# Patient Record
Sex: Female | Born: 1950 | Race: Black or African American | Hispanic: No | Smoking: Never smoker
Health system: Southern US, Community
[De-identification: ages and names within clinical notes are randomized; demographics above are authoritative.]

## PROBLEM LIST (undated history)

## (undated) DIAGNOSIS — I773 Arterial fibromuscular dysplasia: Secondary | ICD-10-CM

## (undated) DIAGNOSIS — D219 Benign neoplasm of connective and other soft tissue, unspecified: Secondary | ICD-10-CM

## (undated) DIAGNOSIS — I10 Essential (primary) hypertension: Secondary | ICD-10-CM

## (undated) DIAGNOSIS — E079 Disorder of thyroid, unspecified: Secondary | ICD-10-CM

## (undated) DIAGNOSIS — M1990 Unspecified osteoarthritis, unspecified site: Secondary | ICD-10-CM

## (undated) DIAGNOSIS — E559 Vitamin D deficiency, unspecified: Secondary | ICD-10-CM

## (undated) DIAGNOSIS — M810 Age-related osteoporosis without current pathological fracture: Secondary | ICD-10-CM

## (undated) HISTORY — DX: Disorder of thyroid, unspecified: E07.9

## (undated) HISTORY — DX: Unspecified osteoarthritis, unspecified site: M19.90

## (undated) HISTORY — DX: Benign neoplasm of connective and other soft tissue, unspecified: D21.9

## (undated) HISTORY — DX: Vitamin D deficiency, unspecified: E55.9

## (undated) HISTORY — DX: Age-related osteoporosis without current pathological fracture: M81.0

---

## 1994-07-27 HISTORY — PX: ABDOMINAL HYSTERECTOMY: SHX81

## 1997-04-30 ENCOUNTER — Encounter: Admission: RE | Admit: 1997-04-30 | Discharge: 1997-04-30 | Payer: Self-pay | Admitting: Sports Medicine

## 1997-05-04 ENCOUNTER — Encounter: Admission: RE | Admit: 1997-05-04 | Discharge: 1997-05-04 | Payer: Self-pay | Admitting: Family Medicine

## 1997-05-07 ENCOUNTER — Encounter: Admission: RE | Admit: 1997-05-07 | Discharge: 1997-05-07 | Payer: Self-pay | Admitting: Family Medicine

## 1997-05-24 ENCOUNTER — Encounter: Admission: RE | Admit: 1997-05-24 | Discharge: 1997-05-24 | Payer: Self-pay | Admitting: Sports Medicine

## 1997-06-06 ENCOUNTER — Encounter: Admission: RE | Admit: 1997-06-06 | Discharge: 1997-06-06 | Payer: Self-pay | Admitting: Family Medicine

## 1997-07-31 ENCOUNTER — Encounter: Admission: RE | Admit: 1997-07-31 | Discharge: 1997-07-31 | Payer: Self-pay | Admitting: Family Medicine

## 1997-08-02 ENCOUNTER — Encounter: Admission: RE | Admit: 1997-08-02 | Discharge: 1997-08-02 | Payer: Self-pay | Admitting: Family Medicine

## 1997-08-07 ENCOUNTER — Encounter: Admission: RE | Admit: 1997-08-07 | Discharge: 1997-08-07 | Payer: Self-pay | Admitting: Family Medicine

## 1997-08-09 ENCOUNTER — Encounter: Admission: RE | Admit: 1997-08-09 | Discharge: 1997-08-09 | Payer: Self-pay | Admitting: Family Medicine

## 1997-09-20 ENCOUNTER — Ambulatory Visit (HOSPITAL_COMMUNITY): Admission: RE | Admit: 1997-09-20 | Discharge: 1997-09-20 | Payer: Self-pay | Admitting: *Deleted

## 1997-09-26 ENCOUNTER — Ambulatory Visit (HOSPITAL_COMMUNITY): Admission: RE | Admit: 1997-09-26 | Discharge: 1997-09-26 | Payer: Self-pay | Admitting: *Deleted

## 1997-09-26 ENCOUNTER — Encounter: Payer: Self-pay | Admitting: *Deleted

## 1998-02-22 ENCOUNTER — Encounter: Admission: RE | Admit: 1998-02-22 | Discharge: 1998-02-22 | Payer: Self-pay | Admitting: Family Medicine

## 1998-02-28 ENCOUNTER — Emergency Department (HOSPITAL_COMMUNITY): Admission: EM | Admit: 1998-02-28 | Discharge: 1998-02-28 | Payer: Self-pay | Admitting: Emergency Medicine

## 1998-05-14 ENCOUNTER — Encounter: Admission: RE | Admit: 1998-05-14 | Discharge: 1998-05-14 | Payer: Self-pay | Admitting: Family Medicine

## 1998-05-20 ENCOUNTER — Encounter: Admission: RE | Admit: 1998-05-20 | Discharge: 1998-05-20 | Payer: Self-pay | Admitting: Family Medicine

## 1998-05-22 ENCOUNTER — Emergency Department (HOSPITAL_COMMUNITY): Admission: EM | Admit: 1998-05-22 | Discharge: 1998-05-22 | Payer: Self-pay | Admitting: Emergency Medicine

## 1998-05-22 ENCOUNTER — Encounter: Payer: Self-pay | Admitting: Emergency Medicine

## 1998-10-01 ENCOUNTER — Encounter: Payer: Self-pay | Admitting: *Deleted

## 1998-10-01 ENCOUNTER — Ambulatory Visit (HOSPITAL_COMMUNITY): Admission: RE | Admit: 1998-10-01 | Discharge: 1998-10-01 | Payer: Self-pay | Admitting: *Deleted

## 1999-01-29 ENCOUNTER — Encounter: Admission: RE | Admit: 1999-01-29 | Discharge: 1999-01-29 | Payer: Self-pay | Admitting: Family Medicine

## 1999-02-23 ENCOUNTER — Emergency Department (HOSPITAL_COMMUNITY): Admission: EM | Admit: 1999-02-23 | Discharge: 1999-02-23 | Payer: Self-pay | Admitting: Emergency Medicine

## 1999-03-28 ENCOUNTER — Encounter: Admission: RE | Admit: 1999-03-28 | Discharge: 1999-03-28 | Payer: Self-pay | Admitting: Family Medicine

## 1999-07-08 ENCOUNTER — Encounter: Admission: RE | Admit: 1999-07-08 | Discharge: 1999-07-08 | Payer: Self-pay | Admitting: Sports Medicine

## 1999-07-28 ENCOUNTER — Ambulatory Visit (HOSPITAL_COMMUNITY): Admission: RE | Admit: 1999-07-28 | Discharge: 1999-07-28 | Payer: Self-pay | Admitting: General Surgery

## 1999-07-28 ENCOUNTER — Encounter (INDEPENDENT_AMBULATORY_CARE_PROVIDER_SITE_OTHER): Payer: Self-pay | Admitting: Specialist

## 1999-10-22 ENCOUNTER — Encounter: Payer: Self-pay | Admitting: *Deleted

## 1999-10-22 ENCOUNTER — Ambulatory Visit (HOSPITAL_COMMUNITY): Admission: RE | Admit: 1999-10-22 | Discharge: 1999-10-22 | Payer: Self-pay | Admitting: *Deleted

## 2000-01-22 ENCOUNTER — Other Ambulatory Visit: Admission: RE | Admit: 2000-01-22 | Discharge: 2000-01-22 | Payer: Self-pay | Admitting: *Deleted

## 2000-07-28 ENCOUNTER — Encounter: Admission: RE | Admit: 2000-07-28 | Discharge: 2000-07-28 | Payer: Self-pay | Admitting: Family Medicine

## 2000-10-22 ENCOUNTER — Ambulatory Visit (HOSPITAL_COMMUNITY): Admission: RE | Admit: 2000-10-22 | Discharge: 2000-10-22 | Payer: Self-pay | Admitting: *Deleted

## 2000-10-22 ENCOUNTER — Encounter: Payer: Self-pay | Admitting: *Deleted

## 2001-02-24 ENCOUNTER — Encounter: Admission: RE | Admit: 2001-02-24 | Discharge: 2001-02-24 | Payer: Self-pay | Admitting: Family Medicine

## 2001-11-30 ENCOUNTER — Encounter: Admission: RE | Admit: 2001-11-30 | Discharge: 2001-11-30 | Payer: Self-pay | Admitting: Family Medicine

## 2001-12-06 ENCOUNTER — Encounter: Admission: RE | Admit: 2001-12-06 | Discharge: 2001-12-06 | Payer: Self-pay | Admitting: Family Medicine

## 2002-05-08 ENCOUNTER — Encounter: Admission: RE | Admit: 2002-05-08 | Discharge: 2002-05-08 | Payer: Self-pay | Admitting: Family Medicine

## 2002-07-27 ENCOUNTER — Encounter: Admission: RE | Admit: 2002-07-27 | Discharge: 2002-07-27 | Payer: Self-pay | Admitting: Family Medicine

## 2003-06-13 ENCOUNTER — Encounter: Admission: RE | Admit: 2003-06-13 | Discharge: 2003-06-13 | Payer: Self-pay | Admitting: Family Medicine

## 2003-06-27 ENCOUNTER — Encounter: Admission: RE | Admit: 2003-06-27 | Discharge: 2003-06-27 | Payer: Self-pay | Admitting: Family Medicine

## 2003-10-31 ENCOUNTER — Ambulatory Visit: Payer: Self-pay

## 2003-11-07 ENCOUNTER — Encounter: Admission: RE | Admit: 2003-11-07 | Discharge: 2003-12-03 | Payer: Self-pay | Admitting: Sports Medicine

## 2003-11-27 ENCOUNTER — Encounter (INDEPENDENT_AMBULATORY_CARE_PROVIDER_SITE_OTHER): Payer: Self-pay | Admitting: *Deleted

## 2003-11-27 LAB — CONVERTED CEMR LAB

## 2003-12-14 ENCOUNTER — Ambulatory Visit: Payer: Self-pay | Admitting: Sports Medicine

## 2004-07-04 ENCOUNTER — Ambulatory Visit: Payer: Self-pay | Admitting: Family Medicine

## 2005-01-12 ENCOUNTER — Ambulatory Visit: Payer: Self-pay | Admitting: Family Medicine

## 2005-02-10 ENCOUNTER — Ambulatory Visit: Payer: Self-pay | Admitting: Family Medicine

## 2005-09-16 ENCOUNTER — Ambulatory Visit: Payer: Self-pay | Admitting: Family Medicine

## 2005-12-21 ENCOUNTER — Ambulatory Visit: Payer: Self-pay | Admitting: Family Medicine

## 2006-03-22 ENCOUNTER — Encounter (INDEPENDENT_AMBULATORY_CARE_PROVIDER_SITE_OTHER): Payer: Self-pay | Admitting: Family Medicine

## 2006-03-25 DIAGNOSIS — I10 Essential (primary) hypertension: Secondary | ICD-10-CM | POA: Insufficient documentation

## 2006-03-25 DIAGNOSIS — N6029 Fibroadenosis of unspecified breast: Secondary | ICD-10-CM

## 2006-03-26 ENCOUNTER — Encounter (INDEPENDENT_AMBULATORY_CARE_PROVIDER_SITE_OTHER): Payer: Self-pay | Admitting: *Deleted

## 2006-04-07 ENCOUNTER — Encounter (INDEPENDENT_AMBULATORY_CARE_PROVIDER_SITE_OTHER): Payer: Self-pay | Admitting: Family Medicine

## 2006-04-09 ENCOUNTER — Telehealth (INDEPENDENT_AMBULATORY_CARE_PROVIDER_SITE_OTHER): Payer: Self-pay | Admitting: Family Medicine

## 2006-04-12 ENCOUNTER — Telehealth: Payer: Self-pay | Admitting: *Deleted

## 2006-04-16 ENCOUNTER — Encounter (INDEPENDENT_AMBULATORY_CARE_PROVIDER_SITE_OTHER): Payer: Self-pay | Admitting: Family Medicine

## 2006-06-01 ENCOUNTER — Ambulatory Visit (HOSPITAL_COMMUNITY): Admission: RE | Admit: 2006-06-01 | Discharge: 2006-06-01 | Payer: Self-pay | Admitting: Family Medicine

## 2006-06-01 ENCOUNTER — Encounter (INDEPENDENT_AMBULATORY_CARE_PROVIDER_SITE_OTHER): Payer: Self-pay | Admitting: Family Medicine

## 2006-06-01 ENCOUNTER — Ambulatory Visit: Payer: Self-pay | Admitting: Family Medicine

## 2006-06-01 LAB — CONVERTED CEMR LAB
Calcium: 9 mg/dL (ref 8.4–10.5)
Creatinine, Ser: 0.74 mg/dL (ref 0.40–1.20)
Sodium: 144 meq/L (ref 135–145)

## 2006-06-02 ENCOUNTER — Telehealth: Payer: Self-pay | Admitting: *Deleted

## 2006-06-03 ENCOUNTER — Encounter (INDEPENDENT_AMBULATORY_CARE_PROVIDER_SITE_OTHER): Payer: Self-pay | Admitting: Family Medicine

## 2006-06-07 ENCOUNTER — Telehealth (INDEPENDENT_AMBULATORY_CARE_PROVIDER_SITE_OTHER): Payer: Self-pay | Admitting: Family Medicine

## 2006-07-13 ENCOUNTER — Ambulatory Visit: Payer: Self-pay | Admitting: Sports Medicine

## 2006-07-20 ENCOUNTER — Telehealth (INDEPENDENT_AMBULATORY_CARE_PROVIDER_SITE_OTHER): Payer: Self-pay | Admitting: Family Medicine

## 2006-07-28 ENCOUNTER — Ambulatory Visit: Payer: Self-pay | Admitting: Cardiology

## 2006-07-28 ENCOUNTER — Ambulatory Visit (HOSPITAL_COMMUNITY): Admission: RE | Admit: 2006-07-28 | Discharge: 2006-07-28 | Payer: Self-pay | Admitting: Family Medicine

## 2006-07-28 ENCOUNTER — Ambulatory Visit: Payer: Self-pay | Admitting: Family Medicine

## 2006-07-28 ENCOUNTER — Encounter (INDEPENDENT_AMBULATORY_CARE_PROVIDER_SITE_OTHER): Payer: Self-pay | Admitting: Family Medicine

## 2006-07-28 ENCOUNTER — Encounter: Payer: Self-pay | Admitting: Family Medicine

## 2006-07-28 LAB — CONVERTED CEMR LAB
BUN: 15 mg/dL (ref 6–23)
CO2: 26 meq/L (ref 19–32)
Creatinine, Ser: 0.81 mg/dL (ref 0.40–1.20)
Glucose, Bld: 102 mg/dL — ABNORMAL HIGH (ref 70–99)
Sodium: 139 meq/L (ref 135–145)

## 2006-07-29 ENCOUNTER — Encounter (INDEPENDENT_AMBULATORY_CARE_PROVIDER_SITE_OTHER): Payer: Self-pay | Admitting: Family Medicine

## 2006-08-09 ENCOUNTER — Ambulatory Visit: Payer: Self-pay | Admitting: Family Medicine

## 2006-09-29 ENCOUNTER — Encounter: Payer: Self-pay | Admitting: *Deleted

## 2006-10-22 ENCOUNTER — Encounter: Admission: RE | Admit: 2006-10-22 | Discharge: 2006-10-22 | Payer: Self-pay | Admitting: Internal Medicine

## 2006-11-10 ENCOUNTER — Encounter: Admission: RE | Admit: 2006-11-10 | Discharge: 2006-11-10 | Payer: Self-pay | Admitting: Internal Medicine

## 2007-02-10 ENCOUNTER — Other Ambulatory Visit: Admission: RE | Admit: 2007-02-10 | Discharge: 2007-02-10 | Payer: Self-pay | Admitting: Obstetrics and Gynecology

## 2007-06-28 ENCOUNTER — Emergency Department (HOSPITAL_COMMUNITY): Admission: EM | Admit: 2007-06-28 | Discharge: 2007-06-29 | Payer: Self-pay | Admitting: Emergency Medicine

## 2009-05-06 ENCOUNTER — Emergency Department (HOSPITAL_BASED_OUTPATIENT_CLINIC_OR_DEPARTMENT_OTHER): Admission: EM | Admit: 2009-05-06 | Discharge: 2009-05-06 | Payer: Self-pay | Admitting: Emergency Medicine

## 2009-05-06 ENCOUNTER — Ambulatory Visit: Payer: Self-pay | Admitting: Diagnostic Radiology

## 2009-05-29 ENCOUNTER — Encounter: Admission: RE | Admit: 2009-05-29 | Discharge: 2009-05-29 | Payer: Self-pay | Admitting: Internal Medicine

## 2009-06-20 ENCOUNTER — Ambulatory Visit (HOSPITAL_COMMUNITY): Admission: RE | Admit: 2009-06-20 | Discharge: 2009-06-20 | Payer: Self-pay | Admitting: Internal Medicine

## 2009-07-31 ENCOUNTER — Other Ambulatory Visit: Admission: RE | Admit: 2009-07-31 | Discharge: 2009-07-31 | Payer: Self-pay | Admitting: Interventional Radiology

## 2009-07-31 ENCOUNTER — Encounter: Admission: RE | Admit: 2009-07-31 | Discharge: 2009-07-31 | Payer: Self-pay | Admitting: Interventional Radiology

## 2009-12-25 ENCOUNTER — Encounter: Admission: RE | Admit: 2009-12-25 | Discharge: 2009-12-25 | Payer: Self-pay | Admitting: Nephrology

## 2009-12-31 ENCOUNTER — Encounter: Admission: RE | Admit: 2009-12-31 | Discharge: 2009-12-31 | Payer: Self-pay | Admitting: Internal Medicine

## 2010-01-16 ENCOUNTER — Ambulatory Visit (HOSPITAL_COMMUNITY)
Admission: RE | Admit: 2010-01-16 | Discharge: 2010-01-16 | Payer: Self-pay | Source: Home / Self Care | Attending: Interventional Radiology | Admitting: Interventional Radiology

## 2010-01-16 HISTORY — PX: OTHER SURGICAL HISTORY: SHX169

## 2010-02-16 ENCOUNTER — Encounter: Payer: Self-pay | Admitting: Nephrology

## 2010-04-07 LAB — CBC
MCH: 29.7 pg (ref 26.0–34.0)
MCHC: 33.5 g/dL (ref 30.0–36.0)
RBC: 4.38 MIL/uL (ref 3.87–5.11)
WBC: 6.2 10*3/uL (ref 4.0–10.5)

## 2010-04-07 LAB — POCT I-STAT, CHEM 8
HCT: 42 % (ref 36.0–46.0)
Potassium: 3.1 mEq/L — ABNORMAL LOW (ref 3.5–5.1)
TCO2: 30 mmol/L (ref 0–100)

## 2010-04-07 LAB — PROTIME-INR
INR: 0.93 (ref 0.00–1.49)
Prothrombin Time: 12.7 seconds (ref 11.6–15.2)

## 2010-04-14 LAB — CBC
HCT: 33.5 % — ABNORMAL LOW (ref 36.0–46.0)
Hemoglobin: 11.6 g/dL — ABNORMAL LOW (ref 12.0–15.0)
MCHC: 34.6 g/dL (ref 30.0–36.0)
MCV: 89.8 fL (ref 78.0–100.0)
Platelets: 205 10*3/uL (ref 150–400)
RDW: 12.9 % (ref 11.5–15.5)

## 2010-04-14 LAB — BASIC METABOLIC PANEL
Creatinine, Ser: 0.79 mg/dL (ref 0.4–1.2)
GFR calc Af Amer: >60 mL/min (ref >60)
Glucose, Bld: 100 mg/dL — ABNORMAL HIGH (ref 70–99)
Sodium: 139 mEq/L (ref 135–145)

## 2010-04-14 LAB — PROTIME-INR
INR: 0.96 (ref 0.00–1.49)
Prothrombin Time: 12.7 seconds (ref 11.6–15.2)

## 2010-04-14 LAB — APTT: aPTT: 27 seconds (ref 24–37)

## 2010-04-16 LAB — DIFFERENTIAL
Basophils Absolute: 0.1 10*3/uL (ref 0.0–0.1)
Lymphocytes Relative: 8 % — ABNORMAL LOW (ref 12–46)
Neutro Abs: 7.6 10*3/uL (ref 1.7–7.7)

## 2010-04-16 LAB — CBC
Platelets: 213 10*3/uL (ref 150–400)
RDW: 12.7 % (ref 11.5–15.5)
WBC: 8.5 10*3/uL (ref 4.0–10.5)

## 2010-04-16 LAB — POCT CARDIAC MARKERS
CKMB, poc: <1 ng/mL — ABNORMAL LOW (ref 1.0–8.0)
CKMB, poc: <1 ng/mL — ABNORMAL LOW (ref 1.0–8.0)
Myoglobin, poc: 57.9 ng/mL (ref 12–200)

## 2010-04-16 LAB — BASIC METABOLIC PANEL
BUN: 20 mg/dL (ref 6–23)
Calcium: 9.1 mg/dL (ref 8.4–10.5)
GFR calc non Af Amer: 46 mL/min — ABNORMAL LOW (ref >60)
Glucose, Bld: 135 mg/dL — ABNORMAL HIGH (ref 70–99)
Sodium: 146 mEq/L — ABNORMAL HIGH (ref 135–145)

## 2010-04-16 LAB — D-DIMER, QUANTITATIVE: D-Dimer, Quant: 0.49 ug/mL-FEU — ABNORMAL HIGH (ref 0.00–0.48)

## 2010-05-14 ENCOUNTER — Other Ambulatory Visit: Payer: Self-pay | Admitting: Interventional Radiology

## 2010-05-14 DIAGNOSIS — I701 Atherosclerosis of renal artery: Secondary | ICD-10-CM

## 2010-06-04 ENCOUNTER — Inpatient Hospital Stay: Admission: RE | Admit: 2010-06-04 | Payer: Self-pay | Source: Ambulatory Visit

## 2010-06-13 NOTE — Op Note (Signed)
[place]  Patient:    ILSE, BILLMAN                    MRN: [id number] Proc. Date: 07/28/99 Adm. Date:  [id number] Attending:  Brandy Hale CC:         Jonnie Finner, M.D., [facility]                           Operative Report  PREOPERATIVE DIAGNOSIS:  Soft tissue mass of right back.  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  OPERATION PERFORMED:  Excision of 8.0 cm diameter soft tissue mass of the right upper back.  SURGEON:  Angelia Mould. Derrell Lolling, M.D.  OPERATIVE INDICATIONS:  This is a 60 year old black female with a two year history of a subcutaneous mass of the right upper midback.  She states that this used to be about the size of a quarter and is now the size of a tennis ball.  No pain, no drainage, no trauma, no inflammation, no prior similar problems.  On exam, she has an 8-10 cm soft tissue mass in the right upper back overlying the lower scapula just above the bra strap.  This mass has the texture and consistency and appearance of a benign lipoma.  Because of enlargement and patients concern for her diagnosis, she is brought to the operating room electively for excision.  OPERATIVE TECHNIQUE:  The patient was brought to the minor procedure room at [facility], placed in the prone position.  The right upper and midback was prepped and draped in a sterile fashion.  The procedure was outlined with the marking pen.  Xylocaine 1% with epinephrine was used as a local infiltration anesthetic.  An oblique incision was made overlying the large soft tissue mass.  Dissection was carried down into the deep subcutaneous tissue, where we dissected out a large encapsulated fatty tumor approximately 8 x 6 x 4 cm in size.  This was not invading the muscle and had the appearance and consistency of a benign lipoma.  This was sent for routine histology.  The wound was irrigated  with saline.  Hemostasis was excellent and achieved with electrocautery.  The subcutaneous tissue was closed with interrupted sutures of 3-0 Vicryl and the skin closed with skin staples.  A clean bandage was placed and the patient taken to the recovery room in stable condition. Estimated blood loss was about 5 cc.  Complications:  None.  Sponge, needle, and instrument counts were correct. DD:  07/28/99 TD:  07/28/99 Job: [id number]

## 2010-06-17 ENCOUNTER — Ambulatory Visit
Admission: RE | Admit: 2010-06-17 | Discharge: 2010-06-17 | Disposition: A | Discharge status: Home / Self Care | Payer: BC Managed Care – PPO | Source: Ambulatory Visit | Attending: Interventional Radiology | Admitting: Interventional Radiology

## 2010-06-17 VITALS — BP 152/63 | HR 61 | Temp 97.9°F | Resp 14

## 2010-06-17 DIAGNOSIS — I701 Atherosclerosis of renal artery: Secondary | ICD-10-CM

## 2010-06-17 HISTORY — DX: Essential (primary) hypertension: I10

## 2010-07-01 ENCOUNTER — Other Ambulatory Visit: Payer: Self-pay | Admitting: Internal Medicine

## 2010-07-01 DIAGNOSIS — E041 Nontoxic single thyroid nodule: Secondary | ICD-10-CM

## 2010-07-16 ENCOUNTER — Ambulatory Visit
Admission: RE | Admit: 2010-07-16 | Discharge: 2010-07-16 | Disposition: A | Discharge status: Home / Self Care | Payer: BC Managed Care – PPO | Source: Ambulatory Visit | Attending: Internal Medicine | Admitting: Internal Medicine

## 2010-07-16 DIAGNOSIS — E041 Nontoxic single thyroid nodule: Secondary | ICD-10-CM

## 2010-10-23 LAB — DIFFERENTIAL
Basophils Relative: 0
Monocytes Absolute: 0.4
Monocytes Relative: 6
Neutro Abs: 4.9

## 2010-10-23 LAB — POCT I-STAT, CHEM 8
Creatinine, Ser: 1
Glucose, Bld: 113 — ABNORMAL HIGH
Hemoglobin: 12.6
Potassium: 3.2 — ABNORMAL LOW

## 2010-10-23 LAB — CBC
HCT: 34.3 — ABNORMAL LOW
Hemoglobin: 11.7 — ABNORMAL LOW
MCHC: 34.1
MCV: 89.9
RBC: 3.82 — ABNORMAL LOW

## 2010-10-23 LAB — POCT CARDIAC MARKERS
CKMB, poc: 1.3
Troponin i, poc: <0.05

## 2010-11-12 ENCOUNTER — Other Ambulatory Visit: Payer: Self-pay | Admitting: Interventional Radiology

## 2010-11-12 DIAGNOSIS — I773 Arterial fibromuscular dysplasia: Secondary | ICD-10-CM

## 2010-12-09 ENCOUNTER — Ambulatory Visit
Admission: RE | Admit: 2010-12-09 | Discharge: 2010-12-09 | Disposition: A | Discharge status: Home / Self Care | Payer: BC Managed Care – PPO | Source: Ambulatory Visit | Attending: Interventional Radiology | Admitting: Interventional Radiology

## 2010-12-09 VITALS — BP 143/67 | HR 55 | Temp 98.0°F | Resp 14 | Ht 66.0 in | Wt 134.0 lb

## 2010-12-09 DIAGNOSIS — I773 Arterial fibromuscular dysplasia: Secondary | ICD-10-CM

## 2010-12-09 HISTORY — DX: Arterial fibromuscular dysplasia: I77.3

## 2011-07-06 ENCOUNTER — Other Ambulatory Visit: Payer: Self-pay | Admitting: Internal Medicine

## 2011-07-06 DIAGNOSIS — E049 Nontoxic goiter, unspecified: Secondary | ICD-10-CM

## 2011-07-17 ENCOUNTER — Ambulatory Visit
Admission: RE | Admit: 2011-07-17 | Discharge: 2011-07-17 | Disposition: A | Discharge status: Home / Self Care | Payer: BC Managed Care – PPO | Source: Ambulatory Visit | Attending: Internal Medicine | Admitting: Internal Medicine

## 2011-07-17 DIAGNOSIS — E049 Nontoxic goiter, unspecified: Secondary | ICD-10-CM

## 2012-06-02 ENCOUNTER — Telehealth: Payer: Self-pay | Admitting: *Deleted

## 2012-06-02 NOTE — Telephone Encounter (Signed)
After review of BMD compared to 05/26/10.  There is a decrease at spine by 7.4%.  She went from -0.6 to -1.2 %.  This range is lower normal or upper osteopenic range = needs upper body exercise with weights.  For the right hip compared to 4/12 there is a slight decrease from -1.0 to -1.2 at the total hip reading.this also is upper osteopenic range  For the left hip compared to 4/12 there is a slight decrease from -1.8 to -2.0 at the neck of the hip which is in the lower osteopenic range.  For the hips with the slight changes considering not significant, but must still get in walking, treadmill, or stationary bike riding.   Must continue with calcium and vit D. Recheck again in 2 years.  Have her to get TSH checked again at PCP.  She is now off thyroid med's and that may be causing some bone loss if abnormal.

## 2012-06-02 NOTE — Telephone Encounter (Signed)
Keep in box until patient is notified

## 2012-06-02 NOTE — Telephone Encounter (Signed)
LMTCB  aa   (Need to review BMD results from [facility].)

## 2012-06-02 NOTE — Telephone Encounter (Signed)
[facility] SENT BONE DENSITY REPORT . PLACED ON YOUR DESK . WILL LOCATE CHART. SUE

## 2012-06-03 NOTE — Telephone Encounter (Signed)
Patient called reqeusting Bone Density Results. Read report and information from Shirlyn Goltz, FNP, to patient. Patient states she has appt. Next week with her PCP. Request a copy of this be mailed to her to take to her PCP who is not on EPIC and she is not on My Chart. Will mail this printed report to patient.

## 2012-06-09 ENCOUNTER — Telehealth: Payer: Self-pay | Admitting: *Deleted

## 2012-06-09 NOTE — Telephone Encounter (Signed)
Patient returned phone call to [facility] about her results.

## 2012-06-09 NOTE — Telephone Encounter (Signed)
LVM for pt to return my call in regards to DEXA results.  

## 2012-06-09 NOTE — Telephone Encounter (Signed)
Pt is aware of all DEXA scan results and is agreeable to calcium and vitamin d intake. Pt states she will also continue to exercise.

## 2012-06-09 NOTE — Telephone Encounter (Signed)
Message copied by Osie Bond on Thu Jun 09, 2012  3:02 PM ------      Message from: Ria Comment R      Created: Thu Jun 09, 2012 12:27 PM       Let patient know that since last exam in 2012 there has been a decrease at spine of 7.4 % which puts her at upper osteopenia at the spine.  At the right hip site she had minor loss of 3.4%. This puts her at the lower osteopenic range. Some of this is normal postmenopausal loss and Vit D deficiency.       She needs to continue with exercise and upper body weights and walking.  She must continue with calcium and vit D, repeat exam 2 years. ------

## 2012-06-09 NOTE — Telephone Encounter (Signed)
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  At the right hip site she had minor loss of 3.4%. This puts her at the lower osteopenic range. Some of this is normal postmenopausal loss and Vit D deficiency.       She needs to continue with exercise and upper body weights and walking.  She must continue with calcium and vit D, repeat exam 2 years. ------

## 2012-06-15 ENCOUNTER — Other Ambulatory Visit: Payer: Self-pay | Admitting: Gastroenterology

## 2012-07-11 ENCOUNTER — Other Ambulatory Visit: Payer: BC Managed Care – PPO

## 2012-09-03 ENCOUNTER — Encounter (HOSPITAL_COMMUNITY): Payer: Self-pay | Admitting: Emergency Medicine

## 2012-09-03 ENCOUNTER — Emergency Department (HOSPITAL_COMMUNITY): Payer: BC Managed Care – PPO

## 2012-09-03 ENCOUNTER — Emergency Department (HOSPITAL_COMMUNITY)
Admission: EM | Admit: 2012-09-03 | Discharge: 2012-09-03 | Disposition: A | Discharge status: Home / Self Care | Payer: BC Managed Care – PPO | Attending: Emergency Medicine | Admitting: Emergency Medicine

## 2012-09-03 DIAGNOSIS — Z8679 Personal history of other diseases of the circulatory system: Secondary | ICD-10-CM | POA: Insufficient documentation

## 2012-09-03 DIAGNOSIS — Z79899 Other long term (current) drug therapy: Secondary | ICD-10-CM | POA: Insufficient documentation

## 2012-09-03 DIAGNOSIS — R0789 Other chest pain: Secondary | ICD-10-CM | POA: Insufficient documentation

## 2012-09-03 DIAGNOSIS — R079 Chest pain, unspecified: Secondary | ICD-10-CM

## 2012-09-03 DIAGNOSIS — I10 Essential (primary) hypertension: Secondary | ICD-10-CM | POA: Insufficient documentation

## 2012-09-03 LAB — CBC
HCT: 33.9 % — ABNORMAL LOW (ref 36.0–46.0)
Hemoglobin: 11.3 g/dL — ABNORMAL LOW (ref 12.0–15.0)
MCH: 29.7 pg (ref 26.0–34.0)
MCHC: 33.3 g/dL (ref 30.0–36.0)
MCV: 89.2 fL (ref 78.0–100.0)
RDW: 12.9 % (ref 11.5–15.5)

## 2012-09-03 LAB — BASIC METABOLIC PANEL
BUN: 16 mg/dL (ref 6–23)
Calcium: 9.1 mg/dL (ref 8.4–10.5)
Creatinine, Ser: 0.72 mg/dL (ref 0.50–1.10)
GFR calc Af Amer: >90 mL/min (ref >90)
GFR calc non Af Amer: >90 mL/min (ref >90)
Glucose, Bld: 127 mg/dL — ABNORMAL HIGH (ref 70–99)

## 2012-09-03 MED ORDER — HYDROCODONE-ACETAMINOPHEN 5-325 MG PO TABS: 1.0000 | ORAL_TABLET | ORAL | Status: DC | PRN

## 2012-09-03 MED ORDER — KETOROLAC TROMETHAMINE 30 MG/ML IJ SOLN
30.0000 mg | Freq: Once | INTRAMUSCULAR | Status: AC
  Administered 2012-09-03: 30 mg via INTRAVENOUS
  Filled 2012-09-03: qty 1

## 2012-09-03 MED ORDER — IBUPROFEN 400 MG PO TABS: 400.0000 mg | ORAL_TABLET | Freq: Four times a day (QID) | ORAL | Status: DC | PRN

## 2012-09-03 MED ORDER — OMEPRAZOLE 20 MG PO CPDR: 20.0000 mg | DELAYED_RELEASE_CAPSULE | Freq: Every day | ORAL | Status: DC

## 2012-09-03 MED ORDER — MORPHINE SULFATE 4 MG/ML IJ SOLN
4.0000 mg | Freq: Once | INTRAMUSCULAR | Status: AC
  Administered 2012-09-03: 4 mg via INTRAVENOUS
  Filled 2012-09-03: qty 1

## 2012-09-03 NOTE — ED Notes (Signed)
100% on room air.

## 2012-09-03 NOTE — ED Notes (Signed)
Pt c/o chest tightness upon awakening this am, prior to getting out of bed.  Hx of HTN.  No relieving factors.  Took "2 Tylenals" this am approx 0730 this am.

## 2012-09-03 NOTE — ED Notes (Signed)
Patient remains in the ED.  Please disregard previous note concerning discharge and care handoff.

## 2012-09-03 NOTE — ED Provider Notes (Signed)
CSN: [id number]     Arrival date & time 09/03/12  0905 History     First MD Initiated Contact with Patient 09/03/12 0913     Chief Complaint  Patient presents with  . Chest Pain    The history is provided by the patient.   Patient reports some chest tightness and "soreness" that began this morning when she awoke.  The pain has been persistent through the morning.  She denies shortness of breath.  No diaphoresis.  No nausea or vomiting.  No radiation of her pain.  She reports her movement of her arm seems to worsen her pain.  No recent heavy lifting or strenuous activity.  No fevers or chills.  No cough or congestion.  No history of cardiac disease.  The patient is 62 years old she has a history of hypertension.  She does not smoke cigarettes.  No diabetes or high cholesterol.  She states she has had no recent exertional shortness of breath or chest pain.  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  She has been in her normal state of health.  Her pain is mild in severity.  She tried two Tylenol this morning without improvement in her symptoms    Past Medical History  Diagnosis Date  . Hypertension     Bilateral renal artery fibromuscular dysplasia  . Fibromuscular dysplasia of renal artery     Right   Past Surgical History  Procedure Laterality Date  . Abdominal hysterectomy    . Angioplasty of right renal artery  01-16-2010    Recurrent/residual fibromuscular dysplasia   History reviewed. No pertinent family history. History  Substance Use Topics  . Smoking status: Never Smoker   . Smokeless tobacco: Never Used  . Alcohol Use: Not on file   OB History   Grav Para Term Preterm Abortions TAB SAB Ect Mult Living                 Review of Systems  All other systems reviewed and are negative.    Allergies  Review of patient's allergies indicates no known allergies.  Home Medications   Current Outpatient Rx  Name  Route  Sig  Dispense  Refill  .  amLODipine-olmesartan (AZOR) 10-40 MG per tablet   Oral   Take 1 tablet by mouth every morning.         . metoprolol-hydrochlorothiazide (LOPRESSOR HCT) 50-25 MG per tablet   Oral   Take 1 tablet by mouth daily.           . Multiple Vitamin (MULTIVITAMIN) tablet   Oral   Take 1 tablet by mouth daily.           . Vitamin D, Ergocalciferol, (DRISDOL) 50000 UNITS CAPS   Oral   Take 50,000 Units by mouth every Sunday.          Marland Kitchen HYDROcodone-acetaminophen (NORCO/VICODIN) 5-325 MG per tablet   Oral   Take 1 tablet by mouth every 4 (four) hours as needed for pain.   10 tablet   0   . ibuprofen (ADVIL,MOTRIN) 400 MG tablet   Oral   Take 1 tablet (400 mg total) by mouth every 6 (six) hours as needed for pain.   30 tablet   0   . omeprazole (PRILOSEC) 20 MG capsule   Oral   Take 1 capsule (20 mg total) by mouth daily.   30 capsule   0    BP 137/78  Pulse 81  Temp(Src) 99.2 F (37.3 C) (Oral)  Resp 16 Physical Exam  Nursing note and vitals reviewed. Constitutional: She is oriented to person, place, and time. She appears well-developed and well-nourished. No distress.  HENT:  Head: Normocephalic and atraumatic.  Eyes: EOM are normal.  Neck: Normal range of motion.  Cardiovascular: Normal rate, regular rhythm and normal heart sounds.   Pulmonary/Chest: Effort normal and breath sounds normal.  Mild tenderness anterior chest palpation.  No rash  Abdominal: Soft. She exhibits no distension. There is no tenderness.  Musculoskeletal: Normal range of motion.  Neurological: She is alert and oriented to person, place, and time.  Skin: Skin is warm and dry.  Psychiatric: She has a normal mood and affect. Judgment normal.    ED Course   Procedures (including critical care time)  Date: 09/03/2012  Rate: 68  Rhythm: normal sinus rhythm  QRS Axis: normal  Intervals: normal  ST/T Wave abnormalities: normal  Conduction Disutrbances: none  Narrative Interpretation:    Old EKG Reviewed: No significant changes noted      Labs Reviewed  CBC - Abnormal; Notable for the following:    RBC 3.80 (*)    Hemoglobin 11.3 (*)    HCT 33.9 (*)    All other components within normal limits  BASIC METABOLIC PANEL - Abnormal; Notable for the following:    Potassium 3.3 (*)    Glucose, Bld 127 (*)    All other components within normal limits  TROPONIN I  TROPONIN I   Dg Chest 2 View  09/03/2012   *RADIOLOGY REPORT*  Clinical Data: Chest pain  CHEST - 2 VIEW  Comparison: Prior chest x-ray 05/06/2009  Findings: The lungs are well-aerated and free from pulmonary edema, focal airspace consolidation or pulmonary nodule.  Cardiac and mediastinal contours are within normal limits.  No pneumothorax, or pleural effusion. No acute osseous findings. Right renal artery stent incidentally imaged.  IMPRESSION:  No acute cardiopulmonary disease.   Original Report Authenticated By: Malachy Moan, M.D.   1. Chest pain     MDM  Likely musculoskeletal chest pain.  Labs and troponin pending.  Chest x-ray pending.  EKG without abnormality  1:23 PM Patient feels much better this time.  Discharge home in good condition.  EKG and cardiac enzymes x2 are negative.  Chest x-ray without acute pathology.  Suspect musculoskeletal pain.  Discharge home with anti-inflammatories and short course of Vicodin.  She understands to return to the ER for new or worsening symptoms.  Lyanne Co, MD 09/03/12 1323

## 2013-02-28 ENCOUNTER — Encounter: Payer: Self-pay | Admitting: Nurse Practitioner

## 2013-03-02 ENCOUNTER — Encounter: Payer: Self-pay | Admitting: Nurse Practitioner

## 2013-03-02 ENCOUNTER — Ambulatory Visit (INDEPENDENT_AMBULATORY_CARE_PROVIDER_SITE_OTHER): Payer: BC Managed Care – PPO | Admitting: Nurse Practitioner

## 2013-03-02 VITALS — BP 120/76 | HR 68 | Ht 65.75 in | Wt 136.0 lb

## 2013-03-02 DIAGNOSIS — R319 Hematuria, unspecified: Secondary | ICD-10-CM

## 2013-03-02 DIAGNOSIS — Z01419 Encounter for gynecological examination (general) (routine) without abnormal findings: Secondary | ICD-10-CM

## 2013-03-02 DIAGNOSIS — E559 Vitamin D deficiency, unspecified: Secondary | ICD-10-CM

## 2013-03-02 DIAGNOSIS — J322 Chronic ethmoidal sinusitis: Secondary | ICD-10-CM

## 2013-03-02 DIAGNOSIS — Z0000 Encounter for general adult medical examination without abnormal findings: Secondary | ICD-10-CM

## 2013-03-02 LAB — POCT URINALYSIS DIPSTICK
BILIRUBIN UA: NEGATIVE
GLUCOSE UA: NEGATIVE
KETONES UA: NEGATIVE
Leukocytes, UA: NEGATIVE
NITRITE UA: NEGATIVE
PH UA: 6
Protein, UA: NEGATIVE
Urobilinogen, UA: NEGATIVE

## 2013-03-02 MED ORDER — AZITHROMYCIN 250 MG PO TABS: 250.0000 mg | ORAL_TABLET | Freq: Every day | ORAL | Status: DC

## 2013-03-02 MED ORDER — VITAMIN D (ERGOCALCIFEROL) 1.25 MG (50000 UNIT) PO CAPS
50000.0000 [IU] | ORAL_CAPSULE | ORAL | Status: DC
Start: 2013-03-02 — End: 2013-11-08

## 2013-03-02 NOTE — Progress Notes (Signed)
Patient ID: Joanne Duncan, female   DOB: Apr 24, 1950, 63 y.o.   MRN: [id number] 63 y.o. G1P1001 Divorced African American Fe here for annual exam.  No new health problems. Same partner X 5 years. No urinary symptoms. Sinusitis symptoms for a week with thick yellow exudate.  Has appointment with Dr. Minna Antis next Wednesday.  Wants a Z pack.  She has 2 grandchildren.  Patient's last menstrual period was 07/27/1994.          Sexually active: yes  The current method of family planning is hysterectomy and condoms all of the time.    Exercising: no  The patient does not participate in regular exercise at present. Smoker:  no  Health Maintenance: Pap:  01/22/00, WNL (TAH) MMG:  05/23/12, Bi-Rads 1: negative Colonoscopy:  06/15/12, polyps, repeat 5 years BMD:  05/31/12, Osteopenia TDaP:  2008 Labs:  HB: PCP  Urine:  Trace RBC, pH 6.0   reports that she has never smoked. She has never used smokeless tobacco. She reports that she does not drink alcohol or use illicit drugs.  Past Medical History  Diagnosis Date  . Hypertension     Bilateral renal artery fibromuscular dysplasia  . Fibromuscular dysplasia of renal artery     Right  . Fibroid     TAH 7/96  . Thyroid disease     hypothyroid, thyroid goiter  . Vitamin D deficiency     Past Surgical History  Procedure Laterality Date  . Angioplasty of right renal artery  01-16-2010    Recurrent/residual fibromuscular dysplasia  . Abdominal hysterectomy  7/96    ovaries remain, done secondary to  fibroids    Current Outpatient Prescriptions  Medication Sig Dispense Refill  . amLODipine-olmesartan (AZOR) 10-40 MG per tablet Take 1 tablet by mouth every morning.      Marland Kitchen ibuprofen (ADVIL,MOTRIN) 400 MG tablet Take 1 tablet (400 mg total) by mouth every 6 (six) hours as needed for pain.  30 tablet  0  . metoprolol-hydrochlorothiazide (LOPRESSOR HCT) 50-25 MG per tablet Take 1 tablet by mouth daily.        . Multiple Vitamin (MULTIVITAMIN) tablet  Take 1 tablet by mouth daily.        . Vitamin D, Ergocalciferol, (DRISDOL) 50000 UNITS CAPS capsule Take 1 capsule (50,000 Units total) by mouth every Sunday.  30 capsule  3  . azithromycin (ZITHROMAX) 250 MG tablet Take 1 tablet (250 mg total) by mouth daily.  6 tablet  0   No current facility-administered medications for this visit.    Family History  Problem Relation Age of Onset  . Breast cancer Sister 41  . Breast cancer Sister 50    ROS:  Pertinent items are noted in HPI.  Otherwise, a comprehensive ROS was negative.  Exam:   BP 120/76  Pulse 68  Ht 5' 5.75" (1.67 m)  Wt 136 lb (61.689 kg)  BMI 22.12 kg/m2  LMP 07/27/1994 Height: 5' 5.75" (167 cm)  Ht Readings from Last 3 Encounters:  03/02/13 5' 5.75" (1.67 m)  12/09/10 5\' 6"  (1.676 m)  06/01/06 5\' 6"  (1.676 m)    General appearance: alert, cooperative and appears stated age Head: Normocephalic, without obvious abnormality, atraumatic Neck: no adenopathy, supple, symmetrical, trachea midline and thyroid normal to inspection and palpation Lungs: clear to auscultation bilaterally Breasts: normal appearance, no masses or tenderness Heart: regular rate and rhythm Abdomen: soft, non-tender; no masses,  no organomegaly Extremities: extremities normal, atraumatic, no cyanosis or edema Skin:  Skin color, texture, turgor normal. No rashes or lesions Lymph nodes: Cervical, supraclavicular, and axillary nodes normal. No abnormal inguinal nodes palpated Neurologic: Grossly normal   Pelvic: External genitalia:  no lesions              Urethra:  normal appearing urethra with no masses, tenderness or lesions              Bartholin's and Skene's: normal                 Vagina: normal appearing vagina with normal color and discharge, no lesions              Cervix: absent              Pap taken: no Bimanual Exam:  Uterus:  uterus absent              Adnexa: no mass, fullness, tenderness               Rectovaginal: Confirms                Anus:  normal sphincter tone, no lesions  A:  Well Woman with normal exam  S/P TAH secondary to fibroids 07/1994, no ERT  History of HTN, hypothyroid  History of bilateral renal artery fibromuscular dysplasia  Sinusitis   P:   Pap smear as per guidelines   Mammogram due 4/15  RX for Z - pack # 6 take as directed  Counseled on breast self exam, mammography screening, adequate intake of calcium and vitamin D, diet and exercise return annually or prn  An After Visit Summary was printed and given to the patient.

## 2013-03-02 NOTE — Patient Instructions (Signed)

## 2013-03-03 LAB — URINE CULTURE
COLONY COUNT: NO GROWTH
ORGANISM ID, BACTERIA: NO GROWTH

## 2013-03-03 LAB — VITAMIN D 25 HYDROXY (VIT D DEFICIENCY, FRACTURES): Vit D, 25-Hydroxy: 60 ng/mL (ref 30–89)

## 2013-03-03 LAB — URINALYSIS, MICROSCOPIC ONLY
Bacteria, UA: NONE SEEN
CASTS: NONE SEEN
CRYSTALS: NONE SEEN
SQUAMOUS EPITHELIAL / LPF: NONE SEEN

## 2013-03-05 NOTE — Progress Notes (Signed)
Encounter reviewed by Dr. Brook Silva.  

## 2013-03-30 ENCOUNTER — Other Ambulatory Visit: Payer: Self-pay | Admitting: Nurse Practitioner

## 2013-11-07 ENCOUNTER — Telehealth: Payer: Self-pay | Admitting: Nurse Practitioner

## 2013-11-07 NOTE — Telephone Encounter (Signed)
Spoke with patient. She is having clear vaginal discharge that started this morning. Slight pelvic pain that it intermittent which started when she noticed discharge. Denies fevers and vaginal bleeding. Patient requests first available morning appointment. Scheduled for office visit tomorrow with Regina Eck CNM for evaluation.  Patient agreeable.  Routing to provider for final review. Patient agreeable to disposition. Will close encounter

## 2013-11-07 NOTE — Telephone Encounter (Signed)
Pt says she is having a watery discharge. Please call to schedule.

## 2013-11-08 ENCOUNTER — Encounter: Payer: Self-pay | Admitting: Certified Nurse Midwife

## 2013-11-08 ENCOUNTER — Ambulatory Visit (INDEPENDENT_AMBULATORY_CARE_PROVIDER_SITE_OTHER): Payer: BC Managed Care – PPO | Admitting: Certified Nurse Midwife

## 2013-11-08 VITALS — BP 120/74 | HR 70 | Resp 16 | Ht 65.75 in | Wt 138.0 lb

## 2013-11-08 DIAGNOSIS — N952 Postmenopausal atrophic vaginitis: Secondary | ICD-10-CM

## 2013-11-08 NOTE — Patient Instructions (Signed)

## 2013-11-08 NOTE — Progress Notes (Signed)
64 y.o. divorced african american g1p1001 here with complaint of vaginal symptoms of increase discharge that she noted on her underwear only once. Describes discharge as clear, no odor, very slight color. Onset of symptoms 1 days ago. Denies new personal products, some vaginal dryness. Sexual activity 4 days ago with slight discomfort. No STD concerns. Urinary symptoms none .No incontinence or urinary leakage.   O:Healthy female WDWN Affect: normal, orientation x 3  Exam: Abdomen:soft Lymph node: no enlargement or tenderness Pelvic exam: External genital: small superficial laceration to the left of clitoral hood with scant pink exudate, non tender BUS: negative Vagina:atrophic appearance scant clear non odorous discharge noted. Ph:4.5   ,Wet prep taken Cervix: normal, non tender Uterus: normal, non tender Adnexa:normal, non tender, no masses or fullness noted   Wet Prep results: negative for pathogens   A:Atrophic vaginitis Superficial clitoral hood laceration Negative wet prep   P:Discussed findings of Atrophic vaginitis and etiology. Discussed options for treatment with Olive oil, Coconut oil or estrogen. Patient will try Coconut oil 2-3 x weekly and for sexual activity and will advise if problems. Discussed superficial laceration and to use coconut oil to area to prevent further trauma. Patient voiced understanding and will advise if problems.   Rv prn

## 2013-11-17 NOTE — Progress Notes (Signed)
Reviewed personally.  M. Suzanne Isaack Preble, MD.  

## 2013-11-27 ENCOUNTER — Encounter: Payer: Self-pay | Admitting: Certified Nurse Midwife

## 2014-03-07 ENCOUNTER — Encounter: Payer: Self-pay | Admitting: Nurse Practitioner

## 2014-03-07 ENCOUNTER — Ambulatory Visit (INDEPENDENT_AMBULATORY_CARE_PROVIDER_SITE_OTHER): Payer: BLUE CROSS/BLUE SHIELD | Admitting: Nurse Practitioner

## 2014-03-07 VITALS — BP 126/68 | HR 60 | Ht 65.5 in | Wt 140.0 lb

## 2014-03-07 DIAGNOSIS — Z01419 Encounter for gynecological examination (general) (routine) without abnormal findings: Secondary | ICD-10-CM

## 2014-03-07 DIAGNOSIS — Z0000 Encounter for general adult medical examination without abnormal findings: Secondary | ICD-10-CM

## 2014-03-07 DIAGNOSIS — R8290 Unspecified abnormal findings in urine: Secondary | ICD-10-CM

## 2014-03-07 DIAGNOSIS — E559 Vitamin D deficiency, unspecified: Secondary | ICD-10-CM

## 2014-03-07 LAB — POCT URINALYSIS DIPSTICK
BILIRUBIN UA: NEGATIVE
Blood, UA: NEGATIVE
GLUCOSE UA: NEGATIVE
KETONES UA: NEGATIVE
Nitrite, UA: NEGATIVE
PROTEIN UA: NEGATIVE
Urobilinogen, UA: NEGATIVE
pH, UA: 8

## 2014-03-07 NOTE — Progress Notes (Signed)
Patient ID: Joanne Duncan, female   DOB: 02-May-1950, 64 y.o.   MRN: [id number] 64 y.o. G1P1001 Divorced  African American Fe here for annual exam.  No new diagnosis.  Kidney function test is normal.  Has a new partner for a year "friend", no concerns about STD's and declines testing.  Not using condoms.  Going to [place] with son and family this summer.  Now diagnosed as pre diabetic and is only on diet controlled.   Patient's last menstrual period was 07/27/1994.          Sexually active: yes  The current method of family planning is hysterectomy and condoms all of the time.  Exercising: no The patient does not participate in regular exercise at present. Smoker: no  Health Maintenance: Pap: 01/22/00, WNL (TAH) MMG: 05/24/13, Bi-Rads 2:  Benign findings Colonoscopy: 06/15/12, polyps, repeat 5 years BMD: 05/31/12, Osteopenia TDaP: 2008 Shingles: does not want Prevnar 13:does not want Labs: HB:  PCP, we follow Vit D  Urine:  Trace leuk's    reports that she has never smoked. She has never used smokeless tobacco. She reports that she does not drink alcohol or use illicit drugs.  Past Medical History  Diagnosis Date  . Hypertension     Bilateral renal artery fibromuscular dysplasia  . Fibromuscular dysplasia of renal artery     Right  . Fibroid     TAH 7/96  . Thyroid disease     hypothyroid, thyroid goiter  . Vitamin D deficiency     Past Surgical History  Procedure Laterality Date  . Angioplasty of right renal artery  01-16-2010    Recurrent/residual fibromuscular dysplasia  . Abdominal hysterectomy  7/96    ovaries remain, done secondary to  fibroids    Current Outpatient Prescriptions  Medication Sig Dispense Refill  . amLODipine-olmesartan (AZOR) 10-40 MG per tablet Take 1 tablet by mouth every morning.    . Cholecalciferol (VITAMIN D) 2000 UNITS tablet Take 2,000 Units by mouth daily.    . meloxicam (MOBIC) 15 MG tablet Take 15 mg by mouth daily.   0  . metoprolol-hydrochlorothiazide (LOPRESSOR HCT) 50-25 MG per tablet Take 1 tablet by mouth daily.      . Multiple Vitamin (MULTIVITAMIN) tablet Take 1 tablet by mouth daily.       No current facility-administered medications for this visit.    Family History  Problem Relation Age of Onset  . Breast cancer Sister 69  . Breast cancer Sister 50    ROS:  Pertinent items are noted in HPI.  Otherwise, a comprehensive ROS was negative.  Exam:   BP 126/68 mmHg  Pulse 60  Ht 5' 5.5" (1.664 m)  Wt 140 lb (63.504 kg)  BMI 22.93 kg/m2  LMP 07/27/1994 Height: 5' 5.5" (166.4 cm) Ht Readings from Last 3 Encounters:  03/07/14 5' 5.5" (1.664 m)  11/08/13 5' 5.75" (1.67 m)  03/02/13 5' 5.75" (1.67 m)    General appearance: alert, cooperative and appears stated age Head: Normocephalic, without obvious abnormality, atraumatic Neck: no adenopathy, supple, symmetrical, trachea midline and thyroid normal to inspection and palpation Lungs: clear to auscultation bilaterally Breasts: normal appearance, no masses or tenderness Heart: regular rate and rhythm Abdomen: soft, non-tender; no masses,  no organomegaly Extremities: extremities normal, atraumatic, no cyanosis or edema Skin: Skin color, texture, turgor normal. No rashes or lesions Lymph nodes: Cervical, supraclavicular, and axillary nodes normal. No abnormal inguinal nodes palpated Neurologic: Grossly normal   Pelvic: External genitalia:  no lesions              Urethra:  normal appearing urethra with no masses, tenderness or lesions              Bartholin's and Skene's: normal                 Vagina: normal appearing vagina with normal color and discharge, no lesions              Cervix: absent              Pap taken: No. Bimanual Exam:  Uterus:  uterus absent              Adnexa: no mass, fullness, tenderness               Rectovaginal: Confirms               Anus:  normal sphincter tone, no lesions  Chaperone present:   no  A:  Well Woman with normal exam  S/P TAH secondary to fibroids 07/1994, no ERT History of HTN, hypothyroid History of bilateral renal artery fibromuscular dysplasia R/O UTI  P:   Reviewed health and wellness pertinent to exam  Pap smear not taken today  Mammogram is due 4/16  Information about vaginal atrophy  Follow with urine C&S  Counseled on breast self exam, mammography screening, adequate intake of calcium and vitamin D, diet and exercise, Kegel's exercises return annually or prn  An After Visit Summary was printed and given to the patient.

## 2014-03-07 NOTE — Patient Instructions (Addendum)
EXERCISE AND DIET:  We recommended that you start or continue a regular exercise program for good health. Regular exercise means any activity that makes your heart beat faster and makes you sweat.  We recommend exercising at least 30 minutes per day at least 3 days a week, preferably 4 or 5.  We also recommend a diet low in fat and sugar.  Inactivity, poor dietary choices and obesity can cause diabetes, heart attack, stroke, and kidney damage, among others.    ALCOHOL AND SMOKING:  Women should limit their alcohol intake to no more than 7 drinks/beers/glasses of wine (combined, not each!) per week. Moderation of alcohol intake to this level decreases your risk of breast cancer and liver damage. And of course, no recreational drugs are part of a healthy lifestyle.  And absolutely no smoking or even second hand smoke. Most people know smoking can cause heart and lung diseases, but did you know it also contributes to weakening of your bones? Aging of your skin?  Yellowing of your teeth and nails?  CALCIUM AND VITAMIN D:  Adequate intake of calcium and Vitamin D are recommended.  The recommendations for exact amounts of these supplements seem to change often, but generally speaking 600 mg of calcium (either carbonate or citrate) and 800 units of Vitamin D per day seems prudent. Certain women may benefit from higher intake of Vitamin D.  If you are among these women, your doctor will have told you during your visit.    PAP SMEARS:  Pap smears, to check for cervical cancer or precancers,  have traditionally been done yearly, although recent scientific advances have shown that most women can have pap smears less often.  However, every woman still should have a physical exam from her gynecologist every year. It will include a breast check, inspection of the vulva and vagina to check for abnormal growths or skin changes, a visual exam of the cervix, and then an exam to evaluate the size and shape of the uterus and  ovaries.  And after 62 years of age, a rectal exam is indicated to check for rectal cancers. We will also provide age appropriate advice regarding health maintenance, like when you should have certain vaccines, screening for sexually transmitted diseases, bone density testing, colonoscopy, mammograms, etc.   MAMMOGRAMS:  All women over 40 years old should have a yearly mammogram. Many facilities now offer a "3D" mammogram, which may cost around $50 extra out of pocket. If possible,  we recommend you accept the option to have the 3D mammogram performed.  It both reduces the number of women who will be called back for extra views which then turn out to be normal, and it is better than the routine mammogram at detecting truly abnormal areas.    COLONOSCOPY:  Colonoscopy to screen for colon cancer is recommended for all women at age 50.  We know, you hate the idea of the prep.  We agree, BUT, having colon cancer and not knowing it is worse!!  Colon cancer so often starts as a polyp that can be seen and removed at colonscopy, which can quite literally save your life!  And if your first colonoscopy is normal and you have no family history of colon cancer, most women don't have to have it again for 10 years.  Once every ten years, you can do something that may end up saving your life, right?  We will be happy to help you get it scheduled when you are ready.    Be sure to check your insurance coverage so you understand how much it will cost.  It may be covered as a preventative service at no cost, but you should check your particular policy.      Atrophic Vaginitis Atrophic vaginitis is a problem of low levels of estrogen in women. This problem can happen at any age. It is most common in women who have gone through menopause ("the change").  HOW WILL I KNOW IF I HAVE THIS PROBLEM? You may have:  Trouble with peeing (urinating), such as:  Going to the bathroom often.  A hard time holding your pee until you  reach a bathroom.  Leaking pee.  Having pain when you pee.  Itching or a burning feeling.  Vaginal bleeding and spotting.  Pain during sex.  Dryness of the vagina.  A yellow, bad-smelling fluid (discharge) coming from the vagina. HOW WILL MY DOCTOR CHECK FOR THIS PROBLEM?  During your exam, your doctor will likely find the problem.  If there is a vaginal fluid, it may be checked for infection. HOW WILL THIS PROBLEM BE TREATED? Keep the vulvar skin as clean as possible. Moisturizers and lubricants can help with some of the symptoms. Estrogen replacement can help. There are 2 ways to take estrogen:  Systemic estrogen gets estrogen to your whole body. It takes many weeks or months before the symptoms get better.  You take an estrogen pill.  You use a skin patch. This is a patch that you put on your skin.  If you still have your uterus, your doctor may ask you to take a hormone. Talk to your doctor about the right medicine for you.  Estrogen cream.  This puts estrogen only at the part of your body where you apply it. The cream is put into the vagina or put on the vulvar skin. For some women, estrogen cream works faster than pills or the patch. CAN ALL WOMEN WITH THIS PROBLEM USE ESTROGEN? No. Women with certain types of cancer, liver problems, or problems with blood clots should not take estrogen. Your doctor can help you decide the best treatment for your symptoms. Document Released: 07/01/2007 Document Revised: 01/17/2013 Document Reviewed: 07/01/2007 [facility] Patient Information 2015 [place], [place]. This information is not intended to replace advice given to you by your health care provider. Make sure you discuss any questions you have with your health care provider.     You may use extra virgin olive oil or coconut oil Replens is prefilled syringe purchased at drug store

## 2014-03-08 LAB — VITAMIN D 25 HYDROXY (VIT D DEFICIENCY, FRACTURES): Vit D, 25-Hydroxy: 27 ng/mL — ABNORMAL LOW (ref 30–100)

## 2014-03-09 LAB — URINE CULTURE

## 2014-03-09 MED ORDER — VITAMIN D (ERGOCALCIFEROL) 1.25 MG (50000 UNIT) PO CAPS: 50000.0000 [IU] | ORAL_CAPSULE | ORAL | Status: DC

## 2014-03-12 NOTE — Progress Notes (Signed)
Encounter reviewed by Dr. Brook Silva.  

## 2014-06-04 ENCOUNTER — Other Ambulatory Visit (INDEPENDENT_AMBULATORY_CARE_PROVIDER_SITE_OTHER): Payer: BLUE CROSS/BLUE SHIELD

## 2014-06-04 DIAGNOSIS — E559 Vitamin D deficiency, unspecified: Secondary | ICD-10-CM

## 2014-06-05 LAB — VITAMIN D 25 HYDROXY (VIT D DEFICIENCY, FRACTURES): VIT D 25 HYDROXY: 38 ng/mL (ref 30–100)

## 2014-08-03 ENCOUNTER — Encounter (HOSPITAL_COMMUNITY): Payer: Self-pay | Admitting: Emergency Medicine

## 2014-08-03 ENCOUNTER — Emergency Department (HOSPITAL_COMMUNITY)
Admission: EM | Admit: 2014-08-03 | Discharge: 2014-08-03 | Disposition: A | Discharge status: Home / Self Care | Payer: BLUE CROSS/BLUE SHIELD | Attending: Emergency Medicine | Admitting: Emergency Medicine

## 2014-08-03 DIAGNOSIS — M25562 Pain in left knee: Secondary | ICD-10-CM

## 2014-08-03 DIAGNOSIS — Z791 Long term (current) use of non-steroidal anti-inflammatories (NSAID): Secondary | ICD-10-CM | POA: Insufficient documentation

## 2014-08-03 DIAGNOSIS — G44319 Acute post-traumatic headache, not intractable: Secondary | ICD-10-CM | POA: Insufficient documentation

## 2014-08-03 DIAGNOSIS — Y9389 Activity, other specified: Secondary | ICD-10-CM | POA: Insufficient documentation

## 2014-08-03 DIAGNOSIS — I10 Essential (primary) hypertension: Secondary | ICD-10-CM | POA: Diagnosis not present

## 2014-08-03 DIAGNOSIS — Y999 Unspecified external cause status: Secondary | ICD-10-CM | POA: Insufficient documentation

## 2014-08-03 DIAGNOSIS — Z79899 Other long term (current) drug therapy: Secondary | ICD-10-CM | POA: Insufficient documentation

## 2014-08-03 DIAGNOSIS — E559 Vitamin D deficiency, unspecified: Secondary | ICD-10-CM | POA: Insufficient documentation

## 2014-08-03 DIAGNOSIS — Z8742 Personal history of other diseases of the female genital tract: Secondary | ICD-10-CM | POA: Insufficient documentation

## 2014-08-03 DIAGNOSIS — Y92410 Unspecified street and highway as the place of occurrence of the external cause: Secondary | ICD-10-CM | POA: Insufficient documentation

## 2014-08-03 DIAGNOSIS — S8992XA Unspecified injury of left lower leg, initial encounter: Secondary | ICD-10-CM | POA: Insufficient documentation

## 2014-08-03 MED ORDER — HYDROCODONE-ACETAMINOPHEN 5-325 MG PO TABS: 2.0000 | ORAL_TABLET | ORAL | Status: AC | PRN

## 2014-08-03 MED ORDER — LORAZEPAM 1 MG PO TABS: 1.0000 mg | ORAL_TABLET | Freq: Three times a day (TID) | ORAL | Status: DC | PRN

## 2014-08-03 MED ORDER — METHOCARBAMOL 500 MG PO TABS: 500.0000 mg | ORAL_TABLET | Freq: Two times a day (BID) | ORAL | Status: DC | PRN

## 2014-08-03 MED ORDER — IBUPROFEN 800 MG PO TABS: 800.0000 mg | ORAL_TABLET | Freq: Three times a day (TID) | ORAL | Status: DC
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
## 2014-08-03 NOTE — Discharge Instructions (Signed)
Head Injury You have a head injury. Headaches and throwing up (vomiting) are common after a head injury. It should be easy to wake up from sleeping. Sometimes you must stay in the hospital. Most problems happen within the first 24 hours. Side effects may occur up to 7-10 days after the injury.  WHAT ARE THE TYPES OF HEAD INJURIES? Head injuries can be as minor as a bump. Some head injuries can be more severe. More severe head injuries include:  A jarring injury to the brain (concussion).  A bruise of the brain (contusion). This mean there is bleeding in the brain that can cause swelling.  A cracked skull (skull fracture).  Bleeding in the brain that collects, clots, and forms a bump (hematoma). WHEN SHOULD I GET HELP RIGHT AWAY?   You are confused or sleepy.  You cannot be woken up.  You feel sick to your stomach (nauseous) or keep throwing up (vomiting).  Your dizziness or unsteadiness is getting worse.  You have very bad, lasting headaches that are not helped by medicine. Take medicines only as told by your doctor.  You cannot use your arms or legs like normal.  You cannot walk.  You notice changes in the black spots in the center of the colored part of your eye (pupil).  You have clear or bloody fluid coming from your nose or ears.  You have trouble seeing. During the next 24 hours after the injury, you must stay with someone who can watch you. This person should get help right away (call 911 in the [place] if you start to shake and are not able to control it (have seizures), you pass out, or you are unable to wake up. HOW CAN I PREVENT A HEAD INJURY IN THE FUTURE?  Wear seat belts.  Wear a helmet while bike riding and playing sports like football.  Stay away from dangerous activities around the house. WHEN CAN I RETURN TO NORMAL ACTIVITIES AND ATHLETICS? See your doctor before doing these activities. You should not do normal activities or play contact sports until 1 week  after the following symptoms have stopped:  Headache that does not go away.  Dizziness.  Poor attention.  Confusion.  Memory problems.  Sickness to your stomach or throwing up.  Tiredness.  Fussiness.  Bothered by bright lights or loud noises.  Anxiousness or depression.  Restless sleep. MAKE SURE YOU:   Understand these instructions.  Will watch your condition.  Will get help right away if you are not doing well or get worse. Document Released: 12/26/2007 Document Revised: 05/29/2013 Document Reviewed: 09/19/2012 [facility] Patient Information 2015 [place], [place]. This information is not intended to replace advice given to you by your health care provider. Make sure you discuss any questions you have with your health care provider.  Knee Pain The knee is the complex joint between your thigh and your lower leg. It is made up of bones, tendons, ligaments, and cartilage. The bones that make up the knee are:  The femur in the thigh.  The tibia and fibula in the lower leg.  The patella or kneecap riding in the groove on the lower femur. CAUSES  Knee pain is a common complaint with many causes. A few of these causes are:  Injury, such as:  A ruptured ligament or tendon injury.  Torn cartilage.  Medical conditions, such as:  Gout  Arthritis  Infections  Overuse, over training, or overdoing a physical activity. Knee pain can be minor or  severe. Knee pain can accompany debilitating injury. Minor knee problems often respond well to self-care measures or get well on their own. More serious injuries may need medical intervention or even surgery. SYMPTOMS The knee is complex. Symptoms of knee problems can vary widely. Some of the problems are:  Pain with movement and weight bearing.  Swelling and tenderness.  Buckling of the knee.  Inability to straighten or extend your knee.  Your knee locks and you cannot straighten it.  Warmth and redness with pain and  fever.  Deformity or dislocation of the kneecap. DIAGNOSIS  Determining what is wrong may be very straight forward such as when there is an injury. It can also be challenging because of the complexity of the knee. Tests to make a diagnosis may include:  Your caregiver taking a history and doing a physical exam.  Routine X-rays can be used to rule out other problems. X-rays will not reveal a cartilage tear. Some injuries of the knee can be diagnosed by:  Arthroscopy a surgical technique by which a small video camera is inserted through tiny incisions on the sides of the knee. This procedure is used to examine and repair internal knee joint problems. Tiny instruments can be used during arthroscopy to repair the torn knee cartilage (meniscus).  Arthrography is a radiology technique. A contrast liquid is directly injected into the knee joint. Internal structures of the knee joint then become visible on X-ray film.  An MRI scan is a non X-ray radiology procedure in which magnetic fields and a computer produce two- or three-dimensional images of the inside of the knee. Cartilage tears are often visible using an MRI scanner. MRI scans have largely replaced arthrography in diagnosing cartilage tears of the knee.  Blood work.  Examination of the fluid that helps to lubricate the knee joint (synovial fluid). This is done by taking a sample out using a needle and a syringe. TREATMENT The treatment of knee problems depends on the cause. Some of these treatments are:  Depending on the injury, proper casting, splinting, surgery, or physical therapy care will be needed.  Give yourself adequate recovery time. Do not overuse your joints. If you begin to get sore during workout routines, back off. Slow down or do fewer repetitions.  For repetitive activities such as cycling or running, maintain your strength and nutrition.  Alternate muscle groups. For example, if you are a weight lifter, work the upper  body on one day and the lower body the next.  Either tight or weak muscles do not give the proper support for your knee. Tight or weak muscles do not absorb the stress placed on the knee joint. Keep the muscles surrounding the knee strong.  Take care of mechanical problems.  If you have flat feet, orthotics or special shoes may help. See your caregiver if you need help.  Arch supports, sometimes with wedges on the inner or outer aspect of the heel, can help. These can shift pressure away from the side of the knee most bothered by osteoarthritis.  A brace called an "unloader" brace also may be used to help ease the pressure on the most arthritic side of the knee.  If your caregiver has prescribed crutches, braces, wraps or ice, use as directed. The acronym for this is PRICE. This means protection, rest, ice, compression, and elevation.  Nonsteroidal anti-inflammatory drugs (NSAIDs), can help relieve pain. But if taken immediately after an injury, they may actually increase swelling. Take NSAIDs with food in  your stomach. Stop them if you develop stomach problems. Do not take these if you have a history of ulcers, stomach pain, or bleeding from the bowel. Do not take without your caregiver's approval if you have problems with fluid retention, heart failure, or kidney problems.  For ongoing knee problems, physical therapy may be helpful.  Glucosamine and chondroitin are over-the-counter dietary supplements. Both may help relieve the pain of osteoarthritis in the knee. These medicines are different from the usual anti-inflammatory drugs. Glucosamine may decrease the rate of cartilage destruction.  Injections of a corticosteroid drug into your knee joint may help reduce the symptoms of an arthritis flare-up. They may provide pain relief that lasts a few months. You may have to wait a few months between injections. The injections do have a small increased risk of infection, water retention, and  elevated blood sugar levels.  Hyaluronic acid injected into damaged joints may ease pain and provide lubrication. These injections may work by reducing inflammation. A series of shots may give relief for as long as 6 months.  Topical painkillers. Applying certain ointments to your skin may help relieve the pain and stiffness of osteoarthritis. Ask your pharmacist for suggestions. Many over the-counter products are approved for temporary relief of arthritis pain.  In some countries, doctors often prescribe topical NSAIDs for relief of chronic conditions such as arthritis and tendinitis. A review of treatment with NSAID creams found that they worked as well as oral medications but without the serious side effects. PREVENTION  Maintain a healthy weight. Extra pounds put more strain on your joints.  Get strong, stay limber. Weak muscles are a common cause of knee injuries. Stretching is important. Include flexibility exercises in your workouts.  Be smart about exercise. If you have osteoarthritis, chronic knee pain or recurring injuries, you may need to change the way you exercise. This does not mean you have to stop being active. If your knees ache after jogging or playing basketball, consider switching to swimming, water aerobics, or other low-impact activities, at least for a few days a week. Sometimes limiting high-impact activities will provide relief.  Make sure your shoes fit well. Choose footwear that is right for your sport.  Protect your knees. Use the proper gear for knee-sensitive activities. Use kneepads when playing volleyball or laying carpet. Buckle your seat belt every time you drive. Most shattered kneecaps occur in car accidents.  Rest when you are tired. SEEK MEDICAL CARE IF:  You have knee pain that is continual and does not seem to be getting better.  SEEK IMMEDIATE MEDICAL CARE IF:  Your knee joint feels hot to the touch and you have a high fever. MAKE SURE YOU:    Understand these instructions.  Will watch your condition.  Will get help right away if you are not doing well or get worse. Document Released: 11/09/2006 Document Revised: 04/06/2011 Document Reviewed: 11/09/2006 [facility] Patient Information 2015 [place], [place]. This information is not intended to replace advice given to you by your health care provider. Make sure you discuss any questions you have with your health care provider.
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
## 2015-03-14 ENCOUNTER — Ambulatory Visit: Payer: BLUE CROSS/BLUE SHIELD | Admitting: Nurse Practitioner

## 2015-03-20 ENCOUNTER — Ambulatory Visit: Payer: BLUE CROSS/BLUE SHIELD | Admitting: Nurse Practitioner

## 2015-05-24 ENCOUNTER — Ambulatory Visit (INDEPENDENT_AMBULATORY_CARE_PROVIDER_SITE_OTHER): Payer: BLUE CROSS/BLUE SHIELD | Admitting: Nurse Practitioner

## 2015-05-24 ENCOUNTER — Encounter: Payer: Self-pay | Admitting: Nurse Practitioner

## 2015-05-24 VITALS — BP 158/78 | HR 68 | Ht 65.5 in | Wt 141.0 lb

## 2015-05-24 DIAGNOSIS — E559 Vitamin D deficiency, unspecified: Secondary | ICD-10-CM | POA: Diagnosis not present

## 2015-05-24 DIAGNOSIS — I773 Arterial fibromuscular dysplasia: Secondary | ICD-10-CM

## 2015-05-24 DIAGNOSIS — I701 Atherosclerosis of renal artery: Secondary | ICD-10-CM | POA: Diagnosis not present

## 2015-05-24 DIAGNOSIS — Z01419 Encounter for gynecological examination (general) (routine) without abnormal findings: Secondary | ICD-10-CM

## 2015-05-24 DIAGNOSIS — N952 Postmenopausal atrophic vaginitis: Secondary | ICD-10-CM | POA: Diagnosis not present

## 2015-05-24 DIAGNOSIS — Z0000 Encounter for general adult medical examination without abnormal findings: Secondary | ICD-10-CM

## 2015-05-24 LAB — POCT URINALYSIS DIPSTICK
BILIRUBIN UA: NEGATIVE
Glucose, UA: NEGATIVE
KETONES UA: NEGATIVE
LEUKOCYTES UA: NEGATIVE
Nitrite, UA: NEGATIVE
PH UA: 6
Protein, UA: NEGATIVE
RBC UA: NEGATIVE
Urobilinogen, UA: NEGATIVE

## 2015-05-24 NOTE — Patient Instructions (Addendum)
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
## 2015-05-25 LAB — HEPATITIS C ANTIBODY: HCV AB: NEGATIVE

## 2015-05-25 LAB — VITAMIN D 25 HYDROXY (VIT D DEFICIENCY, FRACTURES): Vit D, 25-Hydroxy: 27 ng/mL — ABNORMAL LOW (ref 30–100)

## 2015-05-25 NOTE — Progress Notes (Signed)
Encounter reviewed by Dr. Brook Amundson C. Silva.  

## 2015-06-04 ENCOUNTER — Telehealth: Payer: Self-pay | Admitting: Nurse Practitioner
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
## 2015-06-06 NOTE — Telephone Encounter (Signed)
Message left to return call to [place] at [phone] on home number.  Dexa result from [facility] sent for scan into Medical Record.

## 2015-06-07 NOTE — Telephone Encounter (Signed)
Spoke with patient. Advised of message and results as seen below from [place], [place]. She is agreeable and verbalizes understanding. Appointment scheduled for 06/10/2015 at 8:30 am with Dr.Miller. She is agreeable to date and time.  Cc: Dr.Miller  Routing to provider for final review. Patient agreeable to disposition. Will close encounter.

## 2015-06-10 ENCOUNTER — Ambulatory Visit (INDEPENDENT_AMBULATORY_CARE_PROVIDER_SITE_OTHER): Payer: BLUE CROSS/BLUE SHIELD | Admitting: Obstetrics & Gynecology

## 2015-06-10 ENCOUNTER — Encounter: Payer: Self-pay | Admitting: Obstetrics & Gynecology

## 2015-06-10 VITALS — BP 140/80 | HR 78 | Resp 14 | Ht 65.5 in | Wt 137.6 lb

## 2015-06-10 DIAGNOSIS — I10 Essential (primary) hypertension: Secondary | ICD-10-CM

## 2015-06-10 DIAGNOSIS — M810 Age-related osteoporosis without current pathological fracture: Secondary | ICD-10-CM | POA: Insufficient documentation

## 2015-06-10 LAB — COMPREHENSIVE METABOLIC PANEL
ALBUMIN: 4.6 g/dL (ref 3.6–5.1)
ALK PHOS: 50 U/L (ref 33–130)
ALT: 10 U/L (ref 6–29)
AST: 17 U/L (ref 10–35)
BILIRUBIN TOTAL: 0.4 mg/dL (ref 0.2–1.2)
BUN: 13 mg/dL (ref 7–25)
CALCIUM: 9.5 mg/dL (ref 8.6–10.4)
CO2: 28 mmol/L (ref 20–31)
CREATININE: 0.75 mg/dL (ref 0.50–0.99)
Chloride: 101 mmol/L (ref 98–110)
Glucose, Bld: 94 mg/dL (ref 65–99)
Potassium: 3.6 mmol/L (ref 3.5–5.3)
SODIUM: 142 mmol/L (ref 135–146)
TOTAL PROTEIN: 7.6 g/dL (ref 6.1–8.1)

## 2015-06-10 LAB — TSH: TSH: 1.59 m[IU]/L

## 2015-06-10 LAB — POTASSIUM: POTASSIUM: 3.6 mmol/L (ref 3.5–5.3)

## 2015-06-10 LAB — MAGNESIUM: MAGNESIUM: 2 mg/dL (ref 1.5–2.5)

## 2015-06-10 NOTE — Progress Notes (Addendum)
Subjective:    68 yrs Divorced African American G85P1001  female here to discuss recent BMD obtained 05/29/15.  Pt has hx of osteopenia but this BMD showed osteoporosis in AP spine measurement, T score -2.6.  The femur measurements were both improved and worse compared to BMD in 2014 but still in the osteopenia range.  Pt does not take calcium and consumes, on average, one serving per day.  Recent Vit D was 27 and 2000 IU daily was recommended.  She has not started this.  Pt states she really does not want to be on medication at this time, if possible.    Reviewed prior BMD with pt and compared to 05/29/15 one.     Osteoporosis Risk Factors  Nonmodifiable Personal Hx of fracture as an adult: no Hx of fracture in first-degree relative: no but MGM has hip fracture in her late 70's.  She died due to complications from this. Dementia: no Poor health/frailty: no  Potentially modifiable: Tobacco use: no Low body weight (<127 lbs): no Estrogen deficiency  early menopause (age <45) or bilateral ovariectomy: no  prolonged premenopausal amenorrhea (>1 yr): no Low calcium intake (lifelong): yes Alcohol use more than 2 drinks per day: no Recurrent falls: no Inadequate physical activity: yes  Current calcium and Vit D intake: no calcium, just started Vit D 2000 IU daily  Review of Systems Pertinent items noted in HPI and remainder of comprehensive ROS otherwise negative.     Objective:   PHYSICAL EXAM BP 142/88 mmHg  Pulse 78  Resp 14  Ht 5' 5.5" (1.664 m)  Wt 137 lb 9.6 oz (62.415 kg)  BMI 22.54 kg/m2  LMP 07/27/1994 (Approximate) General appearance: alert, cooperative and appears stated age  Imaging Bone Density: Spine T Score: -2.6, Hip T Score: -2/2   Done on 05/29/15 FRAX score:  Not calculatedf                               Assessment:   Osteoporosis with T score -2.6 in AP spine Hypertension hx H/O thyroid nodule/non-neoplasia goiter, was on synthroid for short while,  with stable follow-up.  Negative nodule biopsies 7/11   Plan:   1.  Patient counseled in adequate calcium and vitamin D exposure.  Calcium - 500 - 1000 mg elemental calcium/day in divided doses  Vitamin D - 2000 IU per day.  She will take 1000 supplement and the rest in the Calcium. 2.  Exercise recommended at least 30 minutes 3 times per week.  Upper body weight training also recommended.  Pt does have gym membership 3.  Will check PTH with intact calcium, Mg, Phos, CMP, and TSH levels today.  If normal, repeat BMD in two years.  If abnormal will manage.  4.  Pt does not smoke and does not consume ETOH. 5.  Fall prevention discussed. 6.  Repeat bone density in 2 years.  ~15 minutes spent with patient >50% of time was in face to face discussion of above.  BP recheck after OV, 140/80

## 2015-06-10 NOTE — Patient Instructions (Signed)
Os cal or Caltrate

## 2015-06-11 LAB — PTH, INTACT AND CALCIUM
Calcium: 9.5 mg/dL (ref 8.4–10.5)
PTH: 58 pg/mL (ref 14–64)

## 2016-05-26 ENCOUNTER — Encounter: Payer: Self-pay | Admitting: Nurse Practitioner

## 2016-05-26 NOTE — Progress Notes (Addendum)
Patient ID: Joanne Duncan, female   DOB: August 18, 1950, 66 y.o.   MRN: [id number]  66 y.o. G1P1001 Divorced  Caucasian Fe here for annual exam.  No other new health problems.  New partner for a year. SA and using condoms each time. At tines she is having a lot of burning post coital and not sure if from the condoms or dryness.  Symptoms do not last very long.  She continues to work ling hours and is now working nights for 2 weeks.   06/10/15 was seen by Dr. Sabra Heck for low BMD score at spine of -2.6.  Pt did not want to start on any medication yet.  The labs of TSH, PTH with intact calcium, Mg, Phos, CMP were all normal.  She does have a low Vit D at 27 and was recommended to start Vit D 2000 IU. She is to repeat BMD 05/2017.   Patient's last menstrual period was 07/27/1994 (approximate).          Sexually active: Yes.    The current method of family planning is status post hysterectomy.    Exercising: No.  The patient does not participate in regular exercise at present. Smoker:  no  Health Maintenance: Pap: 01/22/00, Negative (hysterectomy due to fibroids)  History of Abnormal Pap: yes, years ago MMG: 05/29/15, 3D-no, Density Category B, Bi-Rads 1:  Negative, scheduled for 06/01/16 Self Breast exams: no Colonoscopy: 06/20/08, polyps, repeat 5 years (per GI office 06/12/16 pt is not due until 05/2017) BMD: 05/29/15 T Score: -2.6 Spine / -1.8 Right Femur Neck / -2.20 Left Femur Neck TDaP: 08/27/95 Shingles: Never Pneumonia: Not indicated due to age Hep C and HIV: 05/24/15, HIV not done Labs: PCP takes care of screening labs, we follow Vitamin D    reports that she has never smoked. She has never used smokeless tobacco. She reports that she does not drink alcohol or use drugs.  Past Medical History:  Diagnosis Date  . Fibroid    TAH 7/96  . 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 Hypertension    Bilateral renal artery fibromuscular dysplasia  . Thyroid disease    hypothyroid,  thyroid goiter  . Vitamin D deficiency     Past Surgical History:  Procedure Laterality Date  . ABDOMINAL HYSTERECTOMY  7/96   ovaries remain, done secondary to  fibroids  . Angioplasty of Right Renal Artery  01-16-2010   Recurrent/residual fibromuscular dysplasia    Current Outpatient Prescriptions  Medication Sig Dispense Refill  . amLODipine-olmesartan (AZOR) 10-40 MG per tablet Take 1 tablet by mouth every morning.    . cholecalciferol (VITAMIN D) 1000 units tablet Take 1,000 Units by mouth daily.    Marland Kitchen CINNAMON PO Take 1 tablet by mouth.    Marland Kitchen HYDROcodone-acetaminophen (NORCO/VICODIN) 5-325 MG per tablet Take 2 tablets by mouth every 4 (four) hours as needed. 6 tablet 0  . metoprolol-hydrochlorothiazide (LOPRESSOR HCT) 50-25 MG per tablet Take 1 tablet by mouth at bedtime.     . Multiple Vitamin (MULTIVITAMIN) tablet Take 1 tablet by mouth daily.       No current facility-administered medications for this visit.     Family History  Problem Relation Age of Onset  . Breast cancer Sister 87  . Breast cancer Sister 50    ROS:  Pertinent items are noted in HPI.  Otherwise, a comprehensive ROS was negative.  Exam:   BP 122/76 (BP Location: Right Arm, Patient Position: Sitting,  Cuff Size: Normal)   Pulse 68   Ht _0  (1.651 m)   Wt 138 lb (62.6 kg)   LMP 07/27/1994 (Approximate)   BMI 22.96 kg/m  Height: _1  (165.1 cm) Ht Readings from Last 3 Encounters:  05/27/16 _2  (1.651 m)  06/10/15 5' 5.5" (1.664 m)  05/24/15 5' 5.5" (1.664 m)    General appearance: alert, cooperative and appears stated age Head: Normocephalic, without obvious abnormality, atraumatic Neck: no adenopathy, supple, symmetrical, trachea midline and thyroid normal to inspection and palpation Lungs: clear to auscultation bilaterally Breasts: normal appearance, no masses or tenderness Heart: regular rate and rhythm Abdomen: soft, non-tender; no masses,  no organomegaly Extremities: extremities  normal, atraumatic, no cyanosis or edema Skin: Skin color, texture, turgor normal. No rashes or lesions Lymph nodes: Cervical, supraclavicular, and axillary nodes normal. No abnormal inguinal nodes palpated Neurologic: Grossly normal   Pelvic: External genitalia:  no lesions              Urethra:  normal appearing urethra with no masses, tenderness or lesions              Bartholin's and Skene's: normal                 Vagina: normal appearing vagina with normal color and discharge, no lesions              Cervix: anteverted              Pap taken: No. Bimanual Exam:  Uterus:  normal size, contour, position, consistency, mobility, non-tender              Adnexa: no mass, fullness, tenderness               Rectovaginal: Confirms               Anus:  normal sphincter tone, no lesions  Chaperone present: no per request  A:  Well Woman with normal exam    S/P TAH secondary to fibroids 07/1994, no ERT History of HTN History of bilateral renal artery fibromuscular dysplasia with stent placement  [facility]: breast cancer 2 sisters (pt declines BRCA testing)  History of tubular adenoma of colonic polyps  Atrophic vaginal dryness   P:   Reviewed health and wellness pertinent to exam  Pap smear: no  Mammogram is scheduled for 06/01/16  Advised on various OTC lubrications.  She is due for colonoscopy and we will help schedule that.  She does not want STD's  She does not want BRCA testing at this time  Counseled on breast self exam, mammography screening, adequate intake of calcium and vitamin D, diet and exercise, Kegel's exercises return annually or prn  An After Visit Summary was printed and given to the patient.

## 2016-05-27 ENCOUNTER — Encounter: Payer: Self-pay | Admitting: Nurse Practitioner

## 2016-05-27 ENCOUNTER — Ambulatory Visit (INDEPENDENT_AMBULATORY_CARE_PROVIDER_SITE_OTHER): Payer: BLUE CROSS/BLUE SHIELD | Admitting: Nurse Practitioner

## 2016-05-27 VITALS — BP 122/76 | HR 68 | Ht 65.0 in | Wt 138.0 lb

## 2016-05-27 DIAGNOSIS — N952 Postmenopausal atrophic vaginitis: Secondary | ICD-10-CM

## 2016-05-27 DIAGNOSIS — E559 Vitamin D deficiency, unspecified: Secondary | ICD-10-CM

## 2016-05-27 DIAGNOSIS — I10 Essential (primary) hypertension: Secondary | ICD-10-CM | POA: Diagnosis not present

## 2016-05-27 DIAGNOSIS — I701 Atherosclerosis of renal artery: Secondary | ICD-10-CM

## 2016-05-27 DIAGNOSIS — Z01411 Encounter for gynecological examination (general) (routine) with abnormal findings: Secondary | ICD-10-CM

## 2016-05-27 DIAGNOSIS — M810 Age-related osteoporosis without current pathological fracture: Secondary | ICD-10-CM | POA: Diagnosis not present

## 2016-05-27 DIAGNOSIS — D126 Benign neoplasm of colon, unspecified: Secondary | ICD-10-CM

## 2016-05-27 NOTE — Patient Instructions (Signed)

## 2016-05-27 NOTE — Progress Notes (Signed)
Encounter reviewed by Dr. Kynsie Falkner Amundson C. Silva.  

## 2016-05-28 LAB — VITAMIN D 25 HYDROXY (VIT D DEFICIENCY, FRACTURES): Vit D, 25-Hydroxy: 31 ng/mL (ref 30–100)

## 2016-06-11 ENCOUNTER — Encounter: Payer: Self-pay | Admitting: Nurse Practitioner

## 2016-08-26 ENCOUNTER — Telehealth: Payer: Self-pay | Admitting: Obstetrics and Gynecology

## 2016-08-26 NOTE — Telephone Encounter (Signed)
Left message on voicemail to call and reschedule cancelled appointment. Mail letter °

## 2017-02-23 ENCOUNTER — Other Ambulatory Visit: Payer: Self-pay | Admitting: Internal Medicine

## 2017-02-23 DIAGNOSIS — E049 Nontoxic goiter, unspecified: Secondary | ICD-10-CM

## 2017-03-02 ENCOUNTER — Ambulatory Visit
Admission: RE | Admit: 2017-03-02 | Discharge: 2017-03-02 | Disposition: A | Discharge status: Home / Self Care | Payer: BLUE CROSS/BLUE SHIELD | Source: Ambulatory Visit | Attending: Internal Medicine | Admitting: Internal Medicine

## 2017-03-02 DIAGNOSIS — E049 Nontoxic goiter, unspecified: Secondary | ICD-10-CM

## 2017-03-12 ENCOUNTER — Other Ambulatory Visit: Payer: Self-pay | Admitting: Internal Medicine

## 2017-03-16 ENCOUNTER — Other Ambulatory Visit: Payer: Self-pay | Admitting: Internal Medicine

## 2017-03-22 ENCOUNTER — Other Ambulatory Visit: Payer: Self-pay | Admitting: Internal Medicine

## 2017-03-22 DIAGNOSIS — E041 Nontoxic single thyroid nodule: Secondary | ICD-10-CM

## 2017-03-30 ENCOUNTER — Ambulatory Visit
Admission: RE | Admit: 2017-03-30 | Discharge: 2017-03-30 | Disposition: A | Discharge status: Home / Self Care | Payer: BLUE CROSS/BLUE SHIELD | Source: Ambulatory Visit | Attending: Internal Medicine | Admitting: Internal Medicine

## 2017-03-30 ENCOUNTER — Other Ambulatory Visit (HOSPITAL_COMMUNITY)
Admission: RE | Admit: 2017-03-30 | Discharge: 2017-03-30 | Disposition: A | Discharge status: Home / Self Care | Payer: BLUE CROSS/BLUE SHIELD | Source: Ambulatory Visit | Attending: Physician Assistant | Admitting: Physician Assistant

## 2017-03-30 DIAGNOSIS — E079 Disorder of thyroid, unspecified: Secondary | ICD-10-CM | POA: Diagnosis present

## 2017-03-30 DIAGNOSIS — E041 Nontoxic single thyroid nodule: Secondary | ICD-10-CM

## 2017-03-30 NOTE — Procedures (Signed)
PROCEDURE SUMMARY:  Using direct ultrasound guidance, 5 passes were made using 25 g needles into the nodule within the isthumus of the thyroid.   Ultrasound was used to confirm needle placements on all occasions.   Specimens were sent to Pathology for analysis.  See procedure note under Imaging tab in Epic for full procedure details.  Kohl Polinsky S Telesha Deguzman PA-C 03/30/2017 3:03 PM

## 2017-06-02 ENCOUNTER — Encounter: Payer: Self-pay | Admitting: Obstetrics and Gynecology

## 2017-06-04 ENCOUNTER — Ambulatory Visit: Payer: BLUE CROSS/BLUE SHIELD | Admitting: Nurse Practitioner

## 2018-01-31 ENCOUNTER — Ambulatory Visit (INDEPENDENT_AMBULATORY_CARE_PROVIDER_SITE_OTHER): Payer: BLUE CROSS/BLUE SHIELD | Admitting: Obstetrics and Gynecology

## 2018-01-31 ENCOUNTER — Encounter: Payer: Self-pay | Admitting: Obstetrics and Gynecology

## 2018-01-31 ENCOUNTER — Other Ambulatory Visit: Payer: Self-pay

## 2018-01-31 VITALS — BP 124/82 | HR 80 | Ht 65.0 in | Wt 137.2 lb

## 2018-01-31 DIAGNOSIS — Z803 Family history of malignant neoplasm of breast: Secondary | ICD-10-CM | POA: Diagnosis not present

## 2018-01-31 DIAGNOSIS — Z8639 Personal history of other endocrine, nutritional and metabolic disease: Secondary | ICD-10-CM

## 2018-01-31 DIAGNOSIS — Z01419 Encounter for gynecological examination (general) (routine) without abnormal findings: Secondary | ICD-10-CM

## 2018-01-31 DIAGNOSIS — Z90710 Acquired absence of both cervix and uterus: Secondary | ICD-10-CM

## 2018-01-31 DIAGNOSIS — M810 Age-related osteoporosis without current pathological fracture: Secondary | ICD-10-CM

## 2018-01-31 NOTE — Patient Instructions (Signed)
EXERCISE AND DIET:  We recommended that you start or continue a regular exercise program for good health. Regular exercise means any activity that makes your heart beat faster and makes you sweat.  We recommend exercising at least 30 minutes per day at least 3 days a week, preferably 4 or 5.  We also recommend a diet low in fat and sugar.  Inactivity, poor dietary choices and obesity can cause diabetes, heart attack, stroke, and kidney damage, among others.    ALCOHOL AND SMOKING:  Women should limit their alcohol intake to no more than 7 drinks/beers/glasses of wine (combined, not each!) per week. Moderation of alcohol intake to this level decreases your risk of breast cancer and liver damage. And of course, no recreational drugs are part of a healthy lifestyle.  And absolutely no smoking or even second hand smoke. Most people know smoking can cause heart and lung diseases, but did you know it also contributes to weakening of your bones? Aging of your skin?  Yellowing of your teeth and nails?  CALCIUM AND VITAMIN D:  Adequate intake of calcium and Vitamin D are recommended.  The recommendations for exact amounts of these supplements seem to change often, but generally speaking 1,200 mg of calcium (between diet and supplement) and 800 units of Vitamin D per day seems prudent. Certain women may benefit from higher intake of Vitamin D.  If you are among these women, your doctor will have told you during your visit.    PAP SMEARS:  Pap smears, to check for cervical cancer or precancers,  have traditionally been done yearly, although recent scientific advances have shown that most women can have pap smears less often.  However, every woman still should have a physical exam from her gynecologist every year. It will include a breast check, inspection of the vulva and vagina to check for abnormal growths or skin changes, a visual exam of the cervix, and then an exam to evaluate the size and shape of the uterus and  ovaries.  And after 66 years of age, a rectal exam is indicated to check for rectal cancers. We will also provide age appropriate advice regarding health maintenance, like when you should have certain vaccines, screening for sexually transmitted diseases, bone density testing, colonoscopy, mammograms, etc.   MAMMOGRAMS:  All women over 40 years old should have a yearly mammogram. Many facilities now offer a "3D" mammogram, which may cost around $50 extra out of pocket. If possible,  we recommend you accept the option to have the 3D mammogram performed.  It both reduces the number of women who will be called back for extra views which then turn out to be normal, and it is better than the routine mammogram at detecting truly abnormal areas.    COLON CANCER SCREENING: Now recommend starting at age 45. At this time colonoscopy is not covered for routine screening until 50. There are take home tests that can be done between 45-49.   COLONOSCOPY:  Colonoscopy to screen for colon cancer is recommended for all women at age 50.  We know, you hate the idea of the prep.  We agree, BUT, having colon cancer and not knowing it is worse!!  Colon cancer so often starts as a polyp that can be seen and removed at colonscopy, which can quite literally save your life!  And if your first colonoscopy is normal and you have no family history of colon cancer, most women don't have to have it again for   10 years.  Once every ten years, you can do something that may end up saving your life, right?  We will be happy to help you get it scheduled when you are ready.  Be sure to check your insurance coverage so you understand how much it will cost.  It may be covered as a preventative service at no cost, but you should check your particular policy.      Breast Self-Awareness Breast self-awareness means being familiar with how your breasts look and feel. It involves checking your breasts regularly and reporting any changes to your  health care provider. Practicing breast self-awareness is important. A change in your breasts can be a sign of a serious medical problem. Being familiar with how your breasts look and feel allows you to find any problems early, when treatment is more likely to be successful. All women should practice breast self-awareness, including women who have had breast implants. How to do a breast self-exam One way to learn what is normal for your breasts and whether your breasts are changing is to do a breast self-exam. To do a breast self-exam: Look for Changes  1. Remove all the clothing above your waist. 2. Stand in front of a mirror in a room with good lighting. 3. Put your hands on your hips. 4. Push your hands firmly downward. 5. Compare your breasts in the mirror. Look for differences between them (asymmetry), such as: ? Differences in shape. ? Differences in size. ? Puckers, dips, and bumps in one breast and not the other. 6. Look at each breast for changes in your skin, such as: ? Redness. ? Scaly areas. 7. Look for changes in your nipples, such as: ? Discharge. ? Bleeding. ? Dimpling. ? Redness. ? A change in position. Feel for Changes Carefully feel your breasts for lumps and changes. It is best to do this while lying on your back on the floor and again while sitting or standing in the shower or tub with soapy water on your skin. Feel each breast in the following way:  Place the arm on the side of the breast you are examining above your head.  Feel your breast with the other hand.  Start in the nipple area and make  inch (2 cm) overlapping circles to feel your breast. Use the pads of your three middle fingers to do this. Apply light pressure, then medium pressure, then firm pressure. The light pressure will allow you to feel the tissue closest to the skin. The medium pressure will allow you to feel the tissue that is a little deeper. The firm pressure will allow you to feel the tissue  close to the ribs.  Continue the overlapping circles, moving downward over the breast until you feel your ribs below your breast.  Move one finger-width toward the center of the body. Continue to use the  inch (2 cm) overlapping circles to feel your breast as you move slowly up toward your collarbone.  Continue the up and down exam using all three pressures until you reach your armpit.  Write Down What You Find  Write down what is normal for each breast and any changes that you find. Keep a written record with breast changes or normal findings for each breast. By writing this information down, you do not need to depend only on memory for size, tenderness, or location. Write down where you are in your menstrual cycle, if you are still menstruating. If you are having trouble noticing differences   in your breasts, do not get discouraged. With time you will become more familiar with the variations in your breasts and more comfortable with the exam. How often should I examine my breasts? Examine your breasts every month. If you are breastfeeding, the best time to examine your breasts is after a feeding or after using a breast pump. If you menstruate, the best time to examine your breasts is 5-7 days after your period is over. During your period, your breasts are lumpier, and it may be more difficult to notice changes. When should I see my health care provider? See your health care provider if you notice:  A change in shape or size of your breasts or nipples.  A change in the skin of your breast or nipples, such as a reddened or scaly area.  Unusual discharge from your nipples.  A lump or thick area that was not there before.  Pain in your breasts.  Anything that concerns you.  

## 2018-01-31 NOTE — Progress Notes (Signed)
68 y.o. G12P1001 Divorced Black or [place] American Not Hispanic or Latino female here for annual exam.  H/o hysterectomy, TAH for fibroids. Not currently sexually active. No STD concerns.  C/O intermittent constipation. No urinary c/o.     Patient's last menstrual period was 07/27/1994 (approximate).          Sexually active: Yes.    The current method of family planning is status post hysterectomy.  She has a h/o osteoporosis, declined medication. Recently went up on her vit d intake (low at her primary). Inadequate calcium intake.     Exercising: No.  The patient does not participate in regular exercise at present. Smoker:  no  Health Maintenance: Pap:     01/22/00, Negative (hysterectomy due to fibroids) History of Abnormal Pap: yes, years ago, no cervical surgery.  MMG: 06/01/2016 Birads 1 negative, per patient had mmg in the spring 2019 will call for report Colonoscopy: 05/2017 polyp, repeat in 5 years BMD: 05/29/15 T Score: -2.6 Spine / -1.8 Right Femur Neck / -2.20 Left Femur Neck TDaP: 08/27/95, may be UTD with PCP patient is unsure Gardasil: N/A   reports that she has never smoked. She has never used smokeless tobacco. She reports that she does not drink alcohol or use drugs. She works in Counselling psychologist work, shift work. Works nights. Plans to retire in 2-3 works. She has a grown son and 2 grandchildren all local.   Past Medical History:  Diagnosis Date  . Fibroid    TAH 7/96  . 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 Hypertension    Bilateral renal artery fibromuscular dysplasia  . Osteoporosis   . Thyroid disease    hypothyroid, thyroid goiter  . Vitamin D deficiency     Past Surgical History:  Procedure Laterality Date  . ABDOMINAL HYSTERECTOMY  7/96   ovaries remain, done secondary to  fibroids  . Angioplasty of Right Renal Artery  01-16-2010   Recurrent/residual fibromuscular dysplasia    Current Outpatient Medications  Medication Sig Dispense Refill   . amLODipine-olmesartan (AZOR) 10-40 MG per tablet Take 1 tablet by mouth every morning.    . cholecalciferol (VITAMIN D) 1000 units tablet Take 1,000 Units by mouth daily.    Marland Kitchen HYDROcodone-acetaminophen (NORCO/VICODIN) 5-325 MG per tablet Take 2 tablets by mouth every 4 (four) hours as needed. 6 tablet 0  . metoprolol-hydrochlorothiazide (LOPRESSOR HCT) 50-25 MG per tablet Take 1 tablet by mouth at bedtime.     . Multiple Vitamin (MULTIVITAMIN) tablet Take 1 tablet by mouth daily.      . Calcium-Magnesium-Vitamin D (CITRACAL CALCIUM+D) 600-40-500 MG-MG-UNIT TB24 Take 2 tablets by mouth daily.    Marland Kitchen CINNAMON PO Take 1 tablet by mouth.     No current facility-administered medications for this visit.     Family History  Problem Relation Age of Onset  . Breast cancer Sister 50  . Breast cancer Sister 77  She has 3 sisters and one brothers. One sister was diagnosed at 2, the other was ~50. No other FH of breast, ovarian or colon cancer.   Review of Systems  Constitutional: Negative.   HENT: Negative.   Eyes: Negative.   Respiratory: Negative.   Cardiovascular: Negative.   Gastrointestinal: Negative.   Endocrine: Negative.   Genitourinary: Negative.   Musculoskeletal: Negative.   Skin: Negative.   Allergic/Immunologic: Negative.   Neurological: Negative.   Hematological: Negative.   Psychiatric/Behavioral: Negative.     Exam:   BP 124/82 (  BP Location: Right Arm, Patient Position: Sitting, Cuff Size: Normal)   Pulse 80   Ht 5\' 5"  (1.651 m)   Wt 137 lb 3.2 oz (62.2 kg)   LMP 07/27/1994 (Approximate)   BMI 22.83 kg/m   Weight change: @WEIGHTCHANGE @ Height:   Height: 5\' 5"  (165.1 cm)  Ht Readings from Last 3 Encounters:  01/31/18 5\' 5"  (1.651 m)  05/27/16 5\' 5"  (1.651 m)  06/10/15 5' 5.5" (1.664 m)    General appearance: alert, cooperative and appears stated age Head: Normocephalic, without obvious abnormality, atraumatic Neck: no adenopathy, supple, symmetrical, trachea  midline and thyroid thyromegaly, L>R (prior biopsy) Lungs: clear to auscultation bilaterally Cardiovascular: regular rate and rhythm Breasts: normal appearance, no masses or tenderness Abdomen: soft, non-tender; non distended,  no masses,  no organomegaly Extremities: extremities normal, atraumatic, no cyanosis or edema Skin: Skin color, texture, turgor normal. No rashes or lesions Lymph nodes: Cervical, supraclavicular, and axillary nodes normal. No abnormal inguinal nodes palpated Neurologic: Grossly normal   Pelvic: External genitalia:  no lesions              Urethra:  normal appearing urethra with no masses, tenderness or lesions              Bartholins and Skenes: normal                 Vagina: atrophic appearing vagina with normal color and discharge, no lesions              Cervix: absent               Bimanual Exam:  Uterus:  uterus absent              Adnexa: no mass, fullness, tenderness               Rectovaginal: Confirms               Anus:  normal sphincter tone, no lesions  Chaperone was present for exam.  A:  Well Woman with normal exam  FH of breast cancer, she declines seeing a genetic counselor  Osteoporosis  P:   Get copy of mammogram  DEXA  Discussed breast self exam  Discussed calcium and vit D intake  Labs with primary MD

## 2018-05-25 ENCOUNTER — Other Ambulatory Visit: Payer: Self-pay | Admitting: Internal Medicine

## 2018-05-25 DIAGNOSIS — E042 Nontoxic multinodular goiter: Secondary | ICD-10-CM

## 2018-08-12 ENCOUNTER — Encounter: Payer: Self-pay | Admitting: Obstetrics and Gynecology

## 2018-08-20 ENCOUNTER — Telehealth: Payer: Self-pay | Admitting: Obstetrics and Gynecology

## 2018-08-20 NOTE — Telephone Encounter (Signed)
Please let the patient know that her DEXA shows slight improvement in her spine and worsening in her fip. Her lowest T score currently is -2.4. She is now considered osteopenic not osteoporotic.  Her FRAX 10 year risk of any fracture is 4.2% and of a hop fracture is 0.6%.  She should be getting at least 1,200 mg of calcium daily, continue her vit d and exercise regularly.  She needs another DEXA in 2 years.

## 2018-08-23 NOTE — Telephone Encounter (Signed)
Left message to call Joanne Duncan at [phone]. 

## 2018-08-23 NOTE — Telephone Encounter (Signed)
Spoke with patient. Results given. Patient verbalizes understanding. Encounter closed. 

## 2018-09-16 DIAGNOSIS — G4700 Insomnia, unspecified: Secondary | ICD-10-CM | POA: Insufficient documentation

## 2019-02-06 ENCOUNTER — Other Ambulatory Visit: Payer: Self-pay

## 2019-02-06 NOTE — Progress Notes (Signed)
69 y.o. G1P1001 Divorced Black or [place] American Not Hispanic or Latino female here for annual exam.  H/O TAH for fibroids. No c/o. Not sexually active.     Patient's last menstrual period was 07/27/1994 (approximate).          Sexually active: No.  The current method of family planning is status post hysterectomy.    Exercising: No.  The patient does not participate in regular exercise at present. Smoker:  no  Health Maintenance: Pap:  01/22/00, Negative(hysterectomydue to fibroids) History of abnormal Pap:  Yes years ago no cervical surgery  MMG:  08/12/18 Density B Bi-rads 1 neg . BMD:   08/12/18  significant decrease in right femoral neck all other areas  Stable T-Score -2.4, FRAX 4.4/0.6 Colonoscopy: 05/2017 polyp, repeat in 5 years TDaP:  08/27/95 Gardasil: NA   reports that she has never smoked. She has never used smokeless tobacco. She reports that she does not drink alcohol or use drugs. She has a grown son and 2 grandchildren all local. 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   Past Medical History:  Diagnosis Date  . Fibroid    TAH 7/96  . 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 Hypertension    Bilateral renal artery fibromuscular dysplasia  . Osteoporosis   . Thyroid disease    hypothyroid, thyroid goiter  . Vitamin D deficiency     Past Surgical History:  Procedure Laterality Date  . ABDOMINAL HYSTERECTOMY  7/96   ovaries remain, done secondary to  fibroids  . Angioplasty of Right Renal Artery  01-16-2010   Recurrent/residual fibromuscular dysplasia    Current Outpatient Medications  Medication Sig Dispense Refill  . amLODipine-olmesartan (AZOR) 10-40 MG per tablet Take 1 tablet by mouth every morning.    . Calcium-Magnesium-Vitamin D (CITRACAL CALCIUM+D) 600-40-500 MG-MG-UNIT TB24 Take 2 tablets by mouth daily.    . cholecalciferol (VITAMIN D) 1000 units tablet Take 1,000 Units by mouth  daily.    Marland Kitchen CINNAMON PO Take 1 tablet by mouth.    Marland Kitchen HYDROcodone-acetaminophen (NORCO/VICODIN) 5-325 MG per tablet Take 2 tablets by mouth every 4 (four) hours as needed. 6 tablet 0  . metoprolol-hydrochlorothiazide (LOPRESSOR HCT) 50-25 MG per tablet Take 1 tablet by mouth at bedtime.     . Multiple Vitamin (MULTIVITAMIN) tablet Take 1 tablet by mouth daily.       No current facility-administered medications for this visit.    Family History  Problem Relation Age of Onset  . Breast cancer Sister 104  . Breast cancer Sister 52  She has 3 sisters and one brothers. One sister was diagnosed at 42, the other was ~50. No other FH of breast, ovarian or colon cancer.  Patient declines seeing Dietitian.   Review of Systems  Constitutional: Negative.   HENT: Negative.   Eyes: Negative.   Respiratory: Negative.   Cardiovascular: Negative.   Gastrointestinal: Negative.   Endocrine: Negative.   Genitourinary: Negative.   Musculoskeletal: Negative.   Skin: Negative.   Allergic/Immunologic: Negative.   Neurological: Negative.   Hematological: Negative.   Psychiatric/Behavioral: Negative.   All other systems reviewed and are negative.   Exam:   LMP 07/27/1994 (Approximate)   Weight change: @WEIGHTCHANGE @ Height:      Ht Readings from Last 3 Encounters:  01/31/18 5\' 5"  (1.651 m)  05/27/16 5\' 5"  (1.651 m)  06/10/15 5' 5.5" (1.664 m)  General appearance: alert, cooperative and appears stated age Head: Normocephalic, without obvious abnormality, atraumatic Neck: no adenopathy, supple, symmetrical, trachea midline and thyroid enlarged Lungs: clear to auscultation bilaterally Cardiovascular: regular rate and rhythm Breasts: normal appearance, no masses or tenderness Abdomen: soft, non-tender; non distended,  no masses,  no organomegaly Extremities: extremities normal, atraumatic, no cyanosis or edema Skin: Skin color, texture, turgor normal. No rashes or lesions Lymph nodes:  Cervical, supraclavicular, and axillary nodes normal. No abnormal inguinal nodes palpated Neurologic: Grossly normal   Pelvic: External genitalia:  no lesions              Urethra:  normal appearing urethra with no masses, tenderness or lesions              Bartholins and Skenes: normal                 Vagina: normal appearing vagina with normal color and discharge, no lesions              Cervix: absent               Bimanual Exam:  Uterus:  uterus absent              Adnexa: no mass, fullness, tenderness               Rectovaginal: Confirms               Anus:  normal sphincter tone, no lesions  Gae Dry chaperoned for the exam.  A:  Well Woman with normal exam  FH of breast cancer  H/O osteoporosis, osteopenia at last check  Thyromegaly, thyroid ultrasound ordered by primary, prior benign biopsy  P:   No pap needed  Discussed breast self exam, encouraged her to do them  Declines genetic counseling  Discussed calcium and vit D intake  DEXA UTD  Labs with primary  TDAP today  F/U thyroid with primary

## 2019-02-08 ENCOUNTER — Ambulatory Visit: Payer: BC Managed Care – PPO | Admitting: Obstetrics and Gynecology

## 2019-02-08 ENCOUNTER — Other Ambulatory Visit: Payer: Self-pay

## 2019-02-08 ENCOUNTER — Encounter: Payer: Self-pay | Admitting: Obstetrics and Gynecology

## 2019-02-08 VITALS — BP 122/60 | HR 77 | Temp 98.1°F | Ht 65.75 in | Wt 130.0 lb

## 2019-02-08 DIAGNOSIS — Z23 Encounter for immunization: Secondary | ICD-10-CM

## 2019-02-08 DIAGNOSIS — Z803 Family history of malignant neoplasm of breast: Secondary | ICD-10-CM | POA: Diagnosis not present

## 2019-02-08 DIAGNOSIS — M8580 Other specified disorders of bone density and structure, unspecified site: Secondary | ICD-10-CM

## 2019-02-08 DIAGNOSIS — E010 Iodine-deficiency related diffuse (endemic) goiter: Secondary | ICD-10-CM | POA: Diagnosis not present

## 2019-02-08 DIAGNOSIS — Z01419 Encounter for gynecological examination (general) (routine) without abnormal findings: Secondary | ICD-10-CM | POA: Diagnosis not present

## 2019-02-08 NOTE — Patient Instructions (Signed)
EXERCISE AND DIET:  We recommended that you start or continue a regular exercise program for good health. Regular exercise means any activity that makes your heart beat faster and makes you sweat.  We recommend exercising at least 30 minutes per day at least 3 days a week, preferably 4 or 5.  We also recommend a diet low in fat and sugar.  Inactivity, poor dietary choices and obesity can cause diabetes, heart attack, stroke, and kidney damage, among others.    ALCOHOL AND SMOKING:  Women should limit their alcohol intake to no more than 7 drinks/beers/glasses of wine (combined, not each!) per week. Moderation of alcohol intake to this level decreases your risk of breast cancer and liver damage. And of course, no recreational drugs are part of a healthy lifestyle.  And absolutely no smoking or even second hand smoke. Most people know smoking can cause heart and lung diseases, but did you know it also contributes to weakening of your bones? Aging of your skin?  Yellowing of your teeth and nails?  CALCIUM AND VITAMIN D:  Adequate intake of calcium and Vitamin D are recommended.  The recommendations for exact amounts of these supplements seem to change often, but generally speaking 1,200 mg of calcium (between diet and supplement) and 800 units of Vitamin D per day seems prudent. Certain women may benefit from higher intake of Vitamin D.  If you are among these women, your doctor will have told you during your visit.    PAP SMEARS:  Pap smears, to check for cervical cancer or precancers,  have traditionally been done yearly, although recent scientific advances have shown that most women can have pap smears less often.  However, every woman still should have a physical exam from her gynecologist every year. It will include a breast check, inspection of the vulva and vagina to check for abnormal growths or skin changes, a visual exam of the cervix, and then an exam to evaluate the size and shape of the uterus and  ovaries.  And after 67 years of age, a rectal exam is indicated to check for rectal cancers. We will also provide age appropriate advice regarding health maintenance, like when you should have certain vaccines, screening for sexually transmitted diseases, bone density testing, colonoscopy, mammograms, etc.   MAMMOGRAMS:  All women over 40 years old should have a yearly mammogram. Many facilities now offer a "3D" mammogram, which may cost around $50 extra out of pocket. If possible,  we recommend you accept the option to have the 3D mammogram performed.  It both reduces the number of women who will be called back for extra views which then turn out to be normal, and it is better than the routine mammogram at detecting truly abnormal areas.    COLON CANCER SCREENING: Now recommend starting at age 45. At this time colonoscopy is not covered for routine screening until 50. There are take home tests that can be done between 45-49.   COLONOSCOPY:  Colonoscopy to screen for colon cancer is recommended for all women at age 50.  We know, you hate the idea of the prep.  We agree, BUT, having colon cancer and not knowing it is worse!!  Colon cancer so often starts as a polyp that can be seen and removed at colonscopy, which can quite literally save your life!  And if your first colonoscopy is normal and you have no family history of colon cancer, most women don't have to have it again for   10 years.  Once every ten years, you can do something that may end up saving your life, right?  We will be happy to help you get it scheduled when you are ready.  Be sure to check your insurance coverage so you understand how much it will cost.  It may be covered as a preventative service at no cost, but you should check your particular policy.      Breast Self-Awareness Breast self-awareness means being familiar with how your breasts look and feel. It involves checking your breasts regularly and reporting any changes to your  health care provider. Practicing breast self-awareness is important. A change in your breasts can be a sign of a serious medical problem. Being familiar with how your breasts look and feel allows you to find any problems early, when treatment is more likely to be successful. All women should practice breast self-awareness, including women who have had breast implants. How to do a breast self-exam One way to learn what is normal for your breasts and whether your breasts are changing is to do a breast self-exam. To do a breast self-exam: Look for Changes  1. Remove all the clothing above your waist. 2. Stand in front of a mirror in a room with good lighting. 3. Put your hands on your hips. 4. Push your hands firmly downward. 5. Compare your breasts in the mirror. Look for differences between them (asymmetry), such as: ? Differences in shape. ? Differences in size. ? Puckers, dips, and bumps in one breast and not the other. 6. Look at each breast for changes in your skin, such as: ? Redness. ? Scaly areas. 7. Look for changes in your nipples, such as: ? Discharge. ? Bleeding. ? Dimpling. ? Redness. ? A change in position. Feel for Changes Carefully feel your breasts for lumps and changes. It is best to do this while lying on your back on the floor and again while sitting or standing in the shower or tub with soapy water on your skin. Feel each breast in the following way:  Place the arm on the side of the breast you are examining above your head.  Feel your breast with the other hand.  Start in the nipple area and make  inch (2 cm) overlapping circles to feel your breast. Use the pads of your three middle fingers to do this. Apply light pressure, then medium pressure, then firm pressure. The light pressure will allow you to feel the tissue closest to the skin. The medium pressure will allow you to feel the tissue that is a little deeper. The firm pressure will allow you to feel the tissue  close to the ribs.  Continue the overlapping circles, moving downward over the breast until you feel your ribs below your breast.  Move one finger-width toward the center of the body. Continue to use the  inch (2 cm) overlapping circles to feel your breast as you move slowly up toward your collarbone.  Continue the up and down exam using all three pressures until you reach your armpit.  Write Down What You Find  Write down what is normal for each breast and any changes that you find. Keep a written record with breast changes or normal findings for each breast. By writing this information down, you do not need to depend only on memory for size, tenderness, or location. Write down where you are in your menstrual cycle, if you are still menstruating. If you are having trouble noticing differences   in your breasts, do not get discouraged. With time you will become more familiar with the variations in your breasts and more comfortable with the exam. How often should I examine my breasts? Examine your breasts every month. If you are breastfeeding, the best time to examine your breasts is after a feeding or after using a breast pump. If you menstruate, the best time to examine your breasts is 5-7 days after your period is over. During your period, your breasts are lumpier, and it may be more difficult to notice changes. When should I see my health care provider? See your health care provider if you notice:  A change in shape or size of your breasts or nipples.  A change in the skin of your breast or nipples, such as a reddened or scaly area.  Unusual discharge from your nipples.  A lump or thick area that was not there before.  Pain in your breasts.  Anything that concerns you.  

## 2019-04-11 ENCOUNTER — Other Ambulatory Visit: Payer: Self-pay | Admitting: Internal Medicine

## 2019-04-11 DIAGNOSIS — E042 Nontoxic multinodular goiter: Secondary | ICD-10-CM

## 2019-04-20 ENCOUNTER — Other Ambulatory Visit: Payer: Self-pay

## 2019-04-20 ENCOUNTER — Ambulatory Visit
Admission: RE | Admit: 2019-04-20 | Discharge: 2019-04-20 | Disposition: A | Discharge status: Home / Self Care | Payer: BLUE CROSS/BLUE SHIELD | Source: Ambulatory Visit | Attending: Internal Medicine | Admitting: Internal Medicine

## 2019-04-20 DIAGNOSIS — E042 Nontoxic multinodular goiter: Secondary | ICD-10-CM

## 2019-04-28 ENCOUNTER — Other Ambulatory Visit: Payer: Self-pay | Admitting: Internal Medicine

## 2019-04-28 DIAGNOSIS — E041 Nontoxic single thyroid nodule: Secondary | ICD-10-CM

## 2020-02-14 ENCOUNTER — Ambulatory Visit: Payer: BC Managed Care – PPO | Admitting: Obstetrics and Gynecology

## 2020-04-04 ENCOUNTER — Other Ambulatory Visit: Payer: Self-pay | Admitting: Internal Medicine

## 2020-04-04 DIAGNOSIS — E042 Nontoxic multinodular goiter: Secondary | ICD-10-CM

## 2020-04-16 ENCOUNTER — Ambulatory Visit
Admission: RE | Admit: 2020-04-16 | Discharge: 2020-04-16 | Disposition: A | Discharge status: Home / Self Care | Payer: BC Managed Care – PPO | Source: Ambulatory Visit | Attending: Internal Medicine | Admitting: Internal Medicine

## 2020-04-16 DIAGNOSIS — E042 Nontoxic multinodular goiter: Secondary | ICD-10-CM

## 2020-04-22 ENCOUNTER — Other Ambulatory Visit: Payer: Self-pay | Admitting: Internal Medicine

## 2020-04-22 DIAGNOSIS — E042 Nontoxic multinodular goiter: Secondary | ICD-10-CM
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
## 2020-06-04 ENCOUNTER — Encounter: Payer: Self-pay | Admitting: Obstetrics and Gynecology

## 2020-06-04 ENCOUNTER — Other Ambulatory Visit: Payer: Self-pay

## 2020-06-04 ENCOUNTER — Ambulatory Visit (INDEPENDENT_AMBULATORY_CARE_PROVIDER_SITE_OTHER): Payer: BC Managed Care – PPO | Admitting: Obstetrics and Gynecology

## 2020-06-04 VITALS — BP 122/64 | HR 64 | Ht 66.0 in | Wt 141.0 lb

## 2020-06-04 DIAGNOSIS — Z803 Family history of malignant neoplasm of breast: Secondary | ICD-10-CM | POA: Diagnosis not present

## 2020-06-04 DIAGNOSIS — E042 Nontoxic multinodular goiter: Secondary | ICD-10-CM | POA: Insufficient documentation

## 2020-06-04 DIAGNOSIS — M8580 Other specified disorders of bone density and structure, unspecified site: Secondary | ICD-10-CM

## 2020-06-04 DIAGNOSIS — E559 Vitamin D deficiency, unspecified: Secondary | ICD-10-CM | POA: Insufficient documentation

## 2020-06-04 DIAGNOSIS — Z01419 Encounter for gynecological examination (general) (routine) without abnormal findings: Secondary | ICD-10-CM | POA: Diagnosis not present

## 2020-06-04 DIAGNOSIS — I701 Atherosclerosis of renal artery: Secondary | ICD-10-CM | POA: Insufficient documentation

## 2020-06-04 DIAGNOSIS — D509 Iron deficiency anemia, unspecified: Secondary | ICD-10-CM | POA: Insufficient documentation

## 2020-06-04 DIAGNOSIS — E049 Nontoxic goiter, unspecified: Secondary | ICD-10-CM | POA: Insufficient documentation

## 2020-06-04 DIAGNOSIS — G8929 Other chronic pain: Secondary | ICD-10-CM | POA: Insufficient documentation

## 2020-06-04 DIAGNOSIS — R7301 Impaired fasting glucose: Secondary | ICD-10-CM | POA: Insufficient documentation

## 2020-06-04 DIAGNOSIS — E041 Nontoxic single thyroid nodule: Secondary | ICD-10-CM | POA: Insufficient documentation

## 2020-06-04 DIAGNOSIS — E7800 Pure hypercholesterolemia, unspecified: Secondary | ICD-10-CM | POA: Insufficient documentation

## 2020-06-04 DIAGNOSIS — M1990 Unspecified osteoarthritis, unspecified site: Secondary | ICD-10-CM | POA: Insufficient documentation

## 2020-06-04 DIAGNOSIS — Z79891 Long term (current) use of opiate analgesic: Secondary | ICD-10-CM | POA: Insufficient documentation

## 2020-06-04 NOTE — Patient Instructions (Signed)
Seborrhea keratosis is the name of the skin lesion on your side  EXERCISE   We recommended that you start or continue a regular exercise program for good health. Physical activity is anything that gets your body moving, some is better than none. The CDC recommends 150 minutes per week of Moderate-Intensity Aerobic Activity and 2 or more days of Muscle Strengthening Activity.  Benefits of exercise are limitless: helps weight loss/weight maintenance, improves mood and energy, helps with depression and anxiety, improves sleep, tones and strengthens muscles, improves balance, improves bone density, protects from chronic conditions such as heart disease, high blood pressure and diabetes and so much more. To learn more visit: WhyNotPoker.uy  DIET: Good nutrition starts with a healthy diet of fruits, vegetables, whole grains, and lean protein sources. Drink plenty of water for hydration. Minimize empty calories, sodium, sweets. For more information about dietary recommendations visit: GeekRegister.com.ee and http://schaefer-mitchell.com/  ALCOHOL:  Women should limit their alcohol intake to no more than 7 drinks/beers/glasses of wine (combined, not each!) per week. Moderation of alcohol intake to this level decreases your risk of breast cancer and liver damage.  If you are concerned that you may have a problem, or your friends have told you they are concerned about your drinking, there are many resources to help. A well-known program that is free, effective, and available to all people all over the nation is Alcoholics Anonymous.  Check out this site to learn more: BlockTaxes.se   CALCIUM AND VITAMIN D:  Adequate intake of calcium and Vitamin D are recommended for bone health.  You should be getting between 1000-1200 mg of calcium and 800 units of Vitamin D daily between diet and supplements  PAP SMEARS:  Pap smears, to  check for cervical cancer or precancers,  have traditionally been done yearly, scientific advances have shown that most women can have pap smears less often.  However, every woman still should have a physical exam from her gynecologist every year. It will include a breast check, inspection of the vulva and vagina to check for abnormal growths or skin changes, a visual exam of the cervix, and then an exam to evaluate the size and shape of the uterus and ovaries. We will also provide age appropriate advice regarding health maintenance, like when you should have certain vaccines, screening for sexually transmitted diseases, bone density testing, colonoscopy, mammograms, etc.   MAMMOGRAMS:  All women over 93 years old should have a routine mammogram.   COLON CANCER SCREENING: Now recommend starting at age 23. At this time colonoscopy is not covered for routine screening until 50. There are take home tests that can be done between 45-49.   COLONOSCOPY:  Colonoscopy to screen for colon cancer is recommended for all women at age 53.  We know, you hate the idea of the prep.  We agree, BUT, having colon cancer and not knowing it is worse!!  Colon cancer so often starts as a polyp that can be seen and removed at colonscopy, which can quite literally save your life!  And if your first colonoscopy is normal and you have no family history of colon cancer, most women don't have to have it again for 10 years.  Once every ten years, you can do something that may end up saving your life, right?  We will be happy to help you get it scheduled when you are ready.  Be sure to check your insurance coverage so you understand how much it will cost.  It may  be covered as a preventative service at no cost, but you should check your particular policy.      Breast Self-Awareness Breast self-awareness means being familiar with how your breasts look and feel. It involves checking your breasts regularly and reporting any changes to  your health care provider. Practicing breast self-awareness is important. A change in your breasts can be a sign of a serious medical problem. Being familiar with how your breasts look and feel allows you to find any problems early, when treatment is more likely to be successful. All women should practice breast self-awareness, including women who have had breast implants. How to do a breast self-exam One way to learn what is normal for your breasts and whether your breasts are changing is to do a breast self-exam. To do a breast self-exam: Look for Changes  1. Remove all the clothing above your waist. 2. Stand in front of a mirror in a room with good lighting. 3. Put your hands on your hips. 4. Push your hands firmly downward. 5. Compare your breasts in the mirror. Look for differences between them (asymmetry), such as: ? Differences in shape. ? Differences in size. ? Puckers, dips, and bumps in one breast and not the other. 6. Look at each breast for changes in your skin, such as: ? Redness. ? Scaly areas. 7. Look for changes in your nipples, such as: ? Discharge. ? Bleeding. ? Dimpling. ? Redness. ? A change in position. Feel for Changes Carefully feel your breasts for lumps and changes. It is best to do this while lying on your back on the floor and again while sitting or standing in the shower or tub with soapy water on your skin. Feel each breast in the following way:  Place the arm on the side of the breast you are examining above your head.  Feel your breast with the other hand.  Start in the nipple area and make  inch (2 cm) overlapping circles to feel your breast. Use the pads of your three middle fingers to do this. Apply light pressure, then medium pressure, then firm pressure. The light pressure will allow you to feel the tissue closest to the skin. The medium pressure will allow you to feel the tissue that is a little deeper. The firm pressure will allow you to feel the  tissue close to the ribs.  Continue the overlapping circles, moving downward over the breast until you feel your ribs below your breast.  Move one finger-width toward the center of the body. Continue to use the  inch (2 cm) overlapping circles to feel your breast as you move slowly up toward your collarbone.  Continue the up and down exam using all three pressures until you reach your armpit.  Write Down What You Find  Write down what is normal for each breast and any changes that you find. Keep a written record with breast changes or normal findings for each breast. By writing this information down, you do not need to depend only on memory for size, tenderness, or location. Write down where you are in your menstrual cycle, if you are still menstruating. If you are having trouble noticing differences in your breasts, do not get discouraged. With time you will become more familiar with the variations in your breasts and more comfortable with the exam. How often should I examine my breasts? Examine your breasts every month. If you are breastfeeding, the best time to examine your breasts is after a feeding  or after using a breast pump. If you menstruate, the best time to examine your breasts is 5-7 days after your period is over. During your period, your breasts are lumpier, and it may be more difficult to notice changes. When should I see my health care provider? See your health care provider if you notice:  A change in shape or size of your breasts or nipples.  A change in the skin of your breast or nipples, such as a reddened or scaly area.  Unusual discharge from your nipples.  A lump or thick area that was not there before.  Pain in your breasts.  Anything that concerns you.

## 2020-08-15 ENCOUNTER — Encounter: Payer: Self-pay | Admitting: Obstetrics and Gynecology

## 2020-09-29 ENCOUNTER — Telehealth: Payer: Self-pay | Admitting: Obstetrics and Gynecology

## 2020-09-29 NOTE — Telephone Encounter (Signed)
Please let the patient know that she has persistent osteopenia, her risk of any fracture in the next 10 years is 5.6% and of a hip fracture is 1.1%. She should be getting 1,200 mg of calcium a day between her diet and supplements and at least 800 IU a day of Vit D. She should exercise regularly. I would recommend a follow up DEXA in 2years

## 2020-10-01 NOTE — Telephone Encounter (Signed)
Patient informed with below.  

## 2020-12-28 ENCOUNTER — Telehealth: Payer: Self-pay | Admitting: Obstetrics and Gynecology

## 2020-12-28 DIAGNOSIS — Z9189 Other specified personal risk factors, not elsewhere classified: Secondary | ICD-10-CM

## 2020-12-28 DIAGNOSIS — Z803 Family history of malignant neoplasm of breast: Secondary | ICD-10-CM

## 2020-12-28 NOTE — Telephone Encounter (Signed)
Please let the patient know that at the time of her mammogram in July, [facility] calculated her risk of breast cancer was 21.6%. When the risk a breast cancer is 20% or higher, a breast MRI is recommended (6 months after the mammogram). If she is okay with this, please set up a breast MRI in 2/23.

## 2020-12-30 NOTE — Telephone Encounter (Signed)
Patient aware of the below. Breast MRI to be done in 2/23, I asked patient to make herself a note of this as well. I will keep this encounter open so order can be placed at the end of Jan 2023 for breast MRI at [facility] for Feb 2023.

## 2021-01-09 NOTE — Telephone Encounter (Signed)
Patient called today stating she had received a call about needing to schedule breast MRI and she was "worried".  I reassured patient and explained that based on info provided by her about her family hx of breast cancer the radiologist can plug it in to a model that will calculate lifetime risk of breast cancer.  At a certain rate ins co will often cover MRI and it is recommended as another tool for screening in patient's who are calculated as high risk. She voiced understanding and said she felt okay with it. She knows we will schedule it closer to recommended time.

## 2021-02-18 NOTE — Addendum Note (Signed)
Addended by: Thamas Jaegers on: 02/18/2021 04:15 PM   Modules accepted: Orders

## 2021-02-18 NOTE — Telephone Encounter (Signed)
I called patient to inform her I placed the order for Mri breast for Feb 2023. Patient can call to schedule, she asked I call and leave the # to home voicemail for [facility] to schedule [phone] option 1, option 4.

## 2021-02-19 NOTE — Telephone Encounter (Signed)
Patient scheduled on 02/28/21 for MRI of breast.  Encounter send to [facility] to see if prior approval is needed.

## 2021-02-19 NOTE — Telephone Encounter (Signed)
No authorization required.  Encounter previously closed.

## 2021-02-28 ENCOUNTER — Ambulatory Visit
Admission: RE | Admit: 2021-02-28 | Discharge: 2021-02-28 | Disposition: A | Discharge status: Home / Self Care | Payer: BC Managed Care – PPO | Source: Ambulatory Visit | Attending: Obstetrics and Gynecology | Admitting: Obstetrics and Gynecology

## 2021-02-28 ENCOUNTER — Other Ambulatory Visit: Payer: Self-pay

## 2021-02-28 DIAGNOSIS — Z803 Family history of malignant neoplasm of breast: Secondary | ICD-10-CM

## 2021-02-28 DIAGNOSIS — Z9189 Other specified personal risk factors, not elsewhere classified: Secondary | ICD-10-CM

## 2021-02-28 MED ORDER — GADOBUTROL 1 MMOL/ML IV SOLN
7.0000 mL | Freq: Once | INTRAVENOUS | Status: AC | PRN
  Administered 2021-02-28: 7 mL via INTRAVENOUS

## 2021-05-28 NOTE — Progress Notes (Deleted)
71 y.o. G1P1001 Divorced Black or [place] American Not Hispanic or Latino female here for annual exam.      Patient's last menstrual period was 07/27/1994 (approximate).          Sexually active: {yes no:314532}  The current method of family planning is {contraception:315051}.    Exercising: {yes no:314532}  {types:19826} Smoker:  {YES [id number]  Health Maintenance: Pap:  01/22/00, Negative (hysterectomy due to fibroids) History of abnormal Pap:  yes years ago. No cervical surgery.  MMG:  02/28/21 Bi-rads 2 benign  BMD:   08/15/20 osteopenia  Colonoscopy: 05/2017 polyp, repeat in 5 years  TDaP:  02/08/19 Gardasil: n/a   reports that she has never smoked. She has never used smokeless tobacco. She reports that she does not drink alcohol and does not use drugs.  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     ascorbic acid (VITAMIN C) 500 MG tablet Take 500 mg by mouth daily.     Calcium Carbonate-Vitamin D (CALTRATE 600+D PO) Take by mouth.     cholecalciferol (VITAMIN D) 1000 units tablet Take 1,000 Units by mouth daily.     Cholecalciferol (VITAMIN D3) 1.25 MG (50000 UT) TABS Take by mouth.     CINNAMON PO Take 1 tablet by mouth.     Ferrous Sulfate (IRON) 325 (65 Fe) MG TABS Take by mouth.     HYDROcodone-acetaminophen (NORCO/VICODIN) 5-325 MG per tablet Take 2 tablets by mouth  every 4 (four) hours as needed. 6 tablet 0   Multiple Vitamin (MULTIVITAMIN) tablet Take 1 tablet by mouth daily.     No current facility-administered medications for this visit.    Family History  Problem Relation Age of Onset   Breast cancer Sister 34   Breast cancer Sister 65    Review of Systems  Exam:   LMP 07/27/1994 (Approximate)   Weight change: '@WEIGHTCHANGE'$ @ Height:      Ht Readings from Last 3 Encounters:  06/04/20 '5\' 6"'$  (1.676 m)  02/08/19 5' 5.75" (1.67 m)  01/31/18 '5\' 5"'$  (1.651 m)    General appearance: alert, cooperative and appears stated age Head: Normocephalic, without obvious abnormality, atraumatic Neck: no adenopathy, supple, symmetrical, trachea midline and thyroid {CHL AMB PHY EX THYROID NORM DEFAULT:[phone]::"normal to inspection and palpation"} Lungs: clear to auscultation bilaterally Cardiovascular: regular rate and rhythm Breasts: {Exam; breast:13139::"normal appearance, no masses or tenderness"} Abdomen: soft, non-tender; non distended,  no masses,  no organomegaly Extremities: extremities normal, atraumatic, no cyanosis or edema Skin: Skin color, texture, turgor normal. No rashes or lesions Lymph nodes: Cervical, supraclavicular, and axillary nodes normal. No abnormal inguinal nodes palpated Neurologic: Grossly normal   Pelvic: External genitalia:  no lesions              Urethra:  normal appearing urethra with no masses, tenderness or lesions  Bartholins and Skenes: normal                 Vagina: normal appearing vagina with normal color and discharge, no lesions              Cervix: {CHL AMB PHY EX CERVIX NORM [phone]::"no lesions"}               Bimanual Exam:  Uterus:  {CHL AMB PHY EX UTERUS NORM [phone]::"normal size, contour, position, consistency, mobility, non-tender"}              Adnexa: {CHL AMB PHY EX ADNEXA NO MASS [phone]::"no mass, fullness, tenderness"}               Rectovaginal:  Confirms               Anus:  normal sphincter tone, no lesions  *** chaperoned for the exam.  A:  Well Woman with normal exam  P:

## 2021-06-05 ENCOUNTER — Ambulatory Visit: Payer: BC Managed Care – PPO | Admitting: Obstetrics and Gynecology

## 2021-07-22 NOTE — Progress Notes (Signed)
71 y.o. G1P1001 Divorced Black or [place] American Not Hispanic or Latino female here for annual exam.  H/O TAH for fibroids    Patient's last menstrual period was 07/27/1994 (approximate).          Sexually active: No.  The current method of family planning is status post hysterectomy.    Exercising: no   The patient does not participate in regular exercise at present. Smoker:  no  Health Maintenance: Pap:  01/22/00, Negative (hysterectomy due to fibroids) History of abnormal Pap:  yes many years ago  no cervix surgery  Breast MRI:  02/28/21 Density C Birads 2 Benign  Mammogram 08/15/20, negative. Risk of breast cancer is 21.6% BMD:   08/15/20  low bone mass, T score of -2.1, FRAX 5.6/1.1  Colonoscopy: 05/2017 polyp, repeat in 5 years  TDaP:  02/08/19 Gardasil: n/a   reports that she has never smoked. She has never used smokeless tobacco. She reports that she does not drink alcohol and does not use drugs. 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     ascorbic acid (VITAMIN C) 500 MG tablet Take 500 mg by mouth daily.     Calcium Carbonate-Vitamin D (CALTRATE 600+D PO) Take by mouth.     cholecalciferol (VITAMIN D) 1000 units tablet Take 1,000 Units by mouth daily.     Cholecalciferol  (VITAMIN D3) 1.25 MG (50000 UT) TABS Take by mouth.     CINNAMON PO Take 1 tablet by mouth.     Ferrous Sulfate (IRON) 325 (65 Fe) MG TABS Take by mouth.     HYDROcodone-acetaminophen (NORCO/VICODIN) 5-325 MG per tablet Take 2 tablets by mouth every 4 (four) hours as needed. 6 tablet 0   Multiple Vitamin (MULTIVITAMIN) tablet Take 1 tablet by mouth daily.     No current facility-administered medications for this visit.    Family History  Problem Relation Age of Onset   Breast cancer Sister 23   Breast cancer Sister 9  2 of her 3 sisters with breast cancer.   Review of Systems  All other systems reviewed and are negative.   Exam:   LMP 07/27/1994 (Approximate)   Weight change: '@WEIGHTCHANGE'$ @ Height:      Ht Readings from Last 3 Encounters:  06/04/20 '5\' 6"'$  (1.676 m)  02/08/19 5' 5.75" (1.67 m)  01/31/18 '5\' 5"'$  (1.651 m)    General appearance: alert, cooperative and appears stated age Head: Normocephalic, without obvious abnormality, atraumatic Neck: no adenopathy, supple, symmetrical, trachea midline and thyroid normal to inspection and palpation Lungs: clear to auscultation bilaterally Cardiovascular: regular rate and rhythm Breasts: normal appearance, no masses or tenderness Abdomen: soft, non-tender; non distended,  no masses,  no organomegaly Extremities: extremities normal, atraumatic,  no cyanosis or edema Skin: Skin color, texture, turgor normal. No rashes or lesions Lymph nodes: Cervical, supraclavicular, and axillary nodes normal. No abnormal inguinal nodes palpated Neurologic: Grossly normal   Pelvic: External genitalia:  no lesions              Urethra:  normal appearing urethra with no masses, tenderness or lesions              Bartholins and Skenes: normal                 Vagina: normal appearing vagina with normal color and discharge, no lesions              Cervix: absent               Bimanual Exam:  Uterus:  uterus absent              Adnexa: no mass,  fullness, tenderness               Rectovaginal: Confirms               Anus:  normal sphincter tone, no lesions  Gae Dry, CMA chaperoned for the exam.  1. Well woman exam Discussed breast self exam Labs with primary  2. Increased risk of breast cancer Mammogram this month She had a normal breast MRI in 2/23. We discussed doing the MRI's every 1-2 years given unknown effects of retained contrast  3. Osteopenia, unspecified location DEXA due in 7/24 Discussed calcium and vit D intake

## 2021-07-31 ENCOUNTER — Encounter: Payer: Self-pay | Admitting: Obstetrics and Gynecology

## 2021-07-31 ENCOUNTER — Ambulatory Visit (INDEPENDENT_AMBULATORY_CARE_PROVIDER_SITE_OTHER): Payer: BC Managed Care – PPO | Admitting: Obstetrics and Gynecology

## 2021-07-31 VITALS — BP 128/62 | HR 77 | Ht 66.0 in | Wt 142.0 lb

## 2021-07-31 DIAGNOSIS — Z01419 Encounter for gynecological examination (general) (routine) without abnormal findings: Secondary | ICD-10-CM | POA: Diagnosis not present

## 2021-07-31 DIAGNOSIS — M8580 Other specified disorders of bone density and structure, unspecified site: Secondary | ICD-10-CM | POA: Diagnosis not present

## 2021-07-31 DIAGNOSIS — Z9189 Other specified personal risk factors, not elsewhere classified: Secondary | ICD-10-CM | POA: Diagnosis not present

## 2021-07-31 DIAGNOSIS — M5430 Sciatica, unspecified side: Secondary | ICD-10-CM | POA: Insufficient documentation

## 2021-07-31 NOTE — Patient Instructions (Addendum)
Seborrhea keratosis is the name of the skin lesion on your side, follow up with your primary for confirmation   EXERCISE   We recommended that you start or continue a regular exercise program for good health. Physical activity is anything that gets your body moving, some is better than none. The CDC recommends 150 minutes per week of Moderate-Intensity Aerobic Activity and 2 or more days of Muscle Strengthening Activity.  Benefits of exercise are limitless: helps weight loss/weight maintenance, improves mood and energy, helps with depression and anxiety, improves sleep, tones and strengthens muscles, improves balance, improves bone density, protects from chronic conditions such as heart disease, high blood pressure and diabetes and so much more. To learn more visit: WhyNotPoker.uy  DIET: Good nutrition starts with a healthy diet of fruits, vegetables, whole grains, and lean protein sources. Drink plenty of water for hydration. Minimize empty calories, sodium, sweets. For more information about dietary recommendations visit: GeekRegister.com.ee and http://schaefer-mitchell.com/  ALCOHOL:  Women should limit their alcohol intake to no more than 7 drinks/beers/glasses of wine (combined, not each!) per week. Moderation of alcohol intake to this level decreases your risk of breast cancer and liver damage.  If you are concerned that you may have a problem, or your friends have told you they are concerned about your drinking, there are many resources to help. A well-known program that is free, effective, and available to all people all over the nation is Alcoholics Anonymous.  Check out this site to learn more: BlockTaxes.se   CALCIUM AND VITAMIN D:  Adequate intake of calcium and Vitamin D are recommended for bone health.  You should be getting between 1000-1200 mg of calcium and 800 units of Vitamin D daily between diet  and supplements   MAMMOGRAMS:  All women over 71 years old should have a routine mammogram.   COLON CANCER SCREENING: Now recommend starting at age 75. At this time colonoscopy is not covered for routine screening until 50. There are take home tests that can be done between 45-49.   COLONOSCOPY:  Colonoscopy to screen for colon cancer is recommended for all women at age 59.  We know, you hate the idea of the prep.  We agree, BUT, having colon cancer and not knowing it is worse!!  Colon cancer so often starts as a polyp that can be seen and removed at colonscopy, which can quite literally save your life!  And if your first colonoscopy is normal and you have no family history of colon cancer, most women don't have to have it again for 10 years.  Once every ten years, you can do something that may end up saving your life, right?  We will be happy to help you get it scheduled when you are ready.  Be sure to check your insurance coverage so you understand how much it will cost.  It may be covered as a preventative service at no cost, but you should check your particular policy.      Breast Self-Awareness Breast self-awareness means being familiar with how your breasts look and feel. It involves checking your breasts regularly and reporting any changes to your health care provider. Practicing breast self-awareness is important. A change in your breasts can be a sign of a serious medical problem. Being familiar with how your breasts look and feel allows you to find any problems early, when treatment is more likely to be successful. All women should practice breast self-awareness, including women who have had breast implants. How to  do a breast self-exam One way to learn what is normal for your breasts and whether your breasts are changing is to do a breast self-exam. To do a breast self-exam: Look for Changes  Remove all the clothing above your waist. Stand in front of a mirror in a room with good  lighting. Put your hands on your hips. Push your hands firmly downward. Compare your breasts in the mirror. Look for differences between them (asymmetry), such as: Differences in shape. Differences in size. Puckers, dips, and bumps in one breast and not the other. Look at each breast for changes in your skin, such as: Redness. Scaly areas. Look for changes in your nipples, such as: Discharge. Bleeding. Dimpling. Redness. A change in position. Feel for Changes Carefully feel your breasts for lumps and changes. It is best to do this while lying on your back on the floor and again while sitting or standing in the shower or tub with soapy water on your skin. Feel each breast in the following way: Place the arm on the side of the breast you are examining above your head. Feel your breast with the other hand. Start in the nipple area and make  inch (2 cm) overlapping circles to feel your breast. Use the pads of your three middle fingers to do this. Apply light pressure, then medium pressure, then firm pressure. The light pressure will allow you to feel the tissue closest to the skin. The medium pressure will allow you to feel the tissue that is a little deeper. The firm pressure will allow you to feel the tissue close to the ribs. Continue the overlapping circles, moving downward over the breast until you feel your ribs below your breast. Move one finger-width toward the center of the body. Continue to use the  inch (2 cm) overlapping circles to feel your breast as you move slowly up toward your collarbone. Continue the up and down exam using all three pressures until you reach your armpit.  Write Down What You Find  Write down what is normal for each breast and any changes that you find. Keep a written record with breast changes or normal findings for each breast. By writing this information down, you do not need to depend only on memory for size, tenderness, or location. Write down where you  are in your menstrual cycle, if you are still menstruating. If you are having trouble noticing differences in your breasts, do not get discouraged. With time you will become more familiar with the variations in your breasts and more comfortable with the exam. How often should I examine my breasts? Examine your breasts every month. If you are breastfeeding, the best time to examine your breasts is after a feeding or after using a breast pump. If you menstruate, the best time to examine your breasts is 5-7 days after your period is over. During your period, your breasts are lumpier, and it may be more difficult to notice changes. When should I see my health care provider? See your health care provider if you notice: A change in shape or size of your breasts or nipples. A change in the skin of your breast or nipples, such as a reddened or scaly area. Unusual discharge from your nipples. A lump or thick area that was not there before. Pain in your breasts. Anything that concerns you.

## 2021-08-21 ENCOUNTER — Encounter: Payer: Self-pay | Admitting: Obstetrics and Gynecology

## 2022-07-03 DIAGNOSIS — R350 Frequency of micturition: Secondary | ICD-10-CM | POA: Diagnosis not present

## 2022-07-03 DIAGNOSIS — E559 Vitamin D deficiency, unspecified: Secondary | ICD-10-CM | POA: Diagnosis not present

## 2022-07-03 DIAGNOSIS — R42 Dizziness and giddiness: Secondary | ICD-10-CM | POA: Diagnosis not present

## 2022-07-03 DIAGNOSIS — M25651 Stiffness of right hip, not elsewhere classified: Secondary | ICD-10-CM | POA: Diagnosis not present

## 2022-07-03 DIAGNOSIS — R5383 Other fatigue: Secondary | ICD-10-CM | POA: Diagnosis not present

## 2022-08-26 ENCOUNTER — Ambulatory Visit (INDEPENDENT_AMBULATORY_CARE_PROVIDER_SITE_OTHER): Payer: Medicare HMO | Admitting: Radiology

## 2022-08-26 ENCOUNTER — Encounter: Payer: Self-pay | Admitting: Radiology

## 2022-08-26 VITALS — BP 130/72 | Ht 64.75 in | Wt 153.0 lb

## 2022-08-26 DIAGNOSIS — Z01419 Encounter for gynecological examination (general) (routine) without abnormal findings: Secondary | ICD-10-CM

## 2022-08-26 DIAGNOSIS — Z9189 Other specified personal risk factors, not elsewhere classified: Secondary | ICD-10-CM

## 2022-08-26 DIAGNOSIS — M8580 Other specified disorders of bone density and structure, unspecified site: Secondary | ICD-10-CM

## 2022-08-26 NOTE — Progress Notes (Signed)
   Joanne Duncan 23-Apr-1950 [id number]   History:  72 y.o. G1P1 presents for annual exam. No gyn concerns. Retired 7 months ago, trying to keep busy. Exercises regularly.   Gynecologic History/Health Maintenance Last Pap: 2005. Results were: normal Last mammogram: 2023. Results were: normal Last colonoscopy: 2019. Results were: poylps, due this yea Last Dexa: 2022. Results were: osteopenia   Past medical history, past surgical history, family history and social history were all reviewed and documented in the EPIC chart.  ROS:  A ROS was performed and pertinent positives and negatives are included.  Exam:  Vitals:   08/26/22 0753  BP: 130/72  Weight: 153 lb (69.4 kg)  Height: 5' 4.75" (1.645 m)   Body mass index is 25.66 kg/m.  General appearance:  Normal Thyroid:  Symmetrical, normal in size, without palpable masses or nodularity. Respiratory  Auscultation:  Clear without wheezing or rhonchi Cardiovascular  Auscultation:  Regular rate, without rubs, murmurs or gallops  Edema/varicosities:  Not grossly evident Abdominal  Soft,nontender, without masses, guarding or rebound.  Liver/spleen:  No organomegaly noted  Hernia:  None appreciated  Skin  Inspection:  Grossly normal Breasts: Examined lying and sitting.   Right: Without masses, retractions, nipple discharge or axillary adenopathy.   Left: Without masses, retractions, nipple discharge or axillary adenopathy. Genitourinary   Inguinal/mons:  Normal without inguinal adenopathy  External genitalia:  Normal appearing vulva with no masses, tenderness, or lesions  BUS/Urethra/Skene's glands:  Normal  Vagina:  Normal appearing with normal color and discharge, no lesions. Atrophy mild  Cervix:  absent  Uterus:  absent  Adnexa/parametria:     Rt: Normal in size, without masses or tenderness.   Lt: Normal in size, without masses or tenderness.  Anus and perineum: Normal   Raynelle Fanning, CMA present for  exam  Assessment/Plan:   1. Well woman exam with routine gynecological exam Colonoscopy scheduled next month Labs with PCP  2. Osteopenia, unspecified location - DG Bone Density; Future    Discussed SBE, colonoscopy and DEXA screening as appropriate. Encouraged 160mins/week of cardiovascular and weight bearing exercise minimum. Recommend the use of seatbelts and sunscreen consistently.   Return in 1 year for annual or sooner prn.  Arlie Solomons B WHNP-BC 8:09 AM 08/26/2022

## 2022-08-27 DIAGNOSIS — Z1231 Encounter for screening mammogram for malignant neoplasm of breast: Secondary | ICD-10-CM | POA: Diagnosis not present

## 2022-08-31 DIAGNOSIS — M8588 Other specified disorders of bone density and structure, other site: Secondary | ICD-10-CM | POA: Diagnosis not present

## 2022-08-31 DIAGNOSIS — N958 Other specified menopausal and perimenopausal disorders: Secondary | ICD-10-CM | POA: Diagnosis not present

## 2022-09-01 ENCOUNTER — Encounter: Payer: Self-pay | Admitting: Radiology

## 2022-09-02 ENCOUNTER — Encounter: Payer: Self-pay | Admitting: Radiology

## 2022-09-29 DIAGNOSIS — E041 Nontoxic single thyroid nodule: Secondary | ICD-10-CM | POA: Diagnosis not present

## 2022-09-29 DIAGNOSIS — I10 Essential (primary) hypertension: Secondary | ICD-10-CM | POA: Diagnosis not present

## 2022-09-29 DIAGNOSIS — Z0000 Encounter for general adult medical examination without abnormal findings: Secondary | ICD-10-CM | POA: Diagnosis not present

## 2022-09-29 DIAGNOSIS — R7303 Prediabetes: Secondary | ICD-10-CM | POA: Diagnosis not present

## 2022-09-29 DIAGNOSIS — E7800 Pure hypercholesterolemia, unspecified: Secondary | ICD-10-CM | POA: Diagnosis not present

## 2022-10-06 DIAGNOSIS — E041 Nontoxic single thyroid nodule: Secondary | ICD-10-CM | POA: Diagnosis not present

## 2022-10-06 DIAGNOSIS — Z79891 Long term (current) use of opiate analgesic: Secondary | ICD-10-CM | POA: Diagnosis not present

## 2022-10-06 DIAGNOSIS — E7800 Pure hypercholesterolemia, unspecified: Secondary | ICD-10-CM | POA: Diagnosis not present

## 2022-10-06 DIAGNOSIS — R7303 Prediabetes: Secondary | ICD-10-CM | POA: Diagnosis not present

## 2022-10-06 DIAGNOSIS — E559 Vitamin D deficiency, unspecified: Secondary | ICD-10-CM | POA: Diagnosis not present

## 2022-10-06 DIAGNOSIS — G8929 Other chronic pain: Secondary | ICD-10-CM | POA: Diagnosis not present

## 2022-10-06 DIAGNOSIS — G4700 Insomnia, unspecified: Secondary | ICD-10-CM | POA: Diagnosis not present

## 2022-10-06 DIAGNOSIS — I10 Essential (primary) hypertension: Secondary | ICD-10-CM | POA: Diagnosis not present

## 2022-10-06 DIAGNOSIS — Z0000 Encounter for general adult medical examination without abnormal findings: Secondary | ICD-10-CM | POA: Diagnosis not present

## 2022-11-04 DIAGNOSIS — K5730 Diverticulosis of large intestine without perforation or abscess without bleeding: Secondary | ICD-10-CM | POA: Diagnosis not present

## 2022-11-04 DIAGNOSIS — D122 Benign neoplasm of ascending colon: Secondary | ICD-10-CM | POA: Diagnosis not present

## 2022-11-04 DIAGNOSIS — Z860100 Personal history of colon polyps, unspecified: Secondary | ICD-10-CM | POA: Diagnosis not present

## 2022-11-06 DIAGNOSIS — D122 Benign neoplasm of ascending colon: Secondary | ICD-10-CM | POA: Diagnosis not present

## 2023-01-29 DIAGNOSIS — R1031 Right lower quadrant pain: Secondary | ICD-10-CM | POA: Diagnosis not present

## 2023-02-04 DIAGNOSIS — H5213 Myopia, bilateral: Secondary | ICD-10-CM | POA: Diagnosis not present

## 2023-02-04 DIAGNOSIS — E119 Type 2 diabetes mellitus without complications: Secondary | ICD-10-CM | POA: Diagnosis not present

## 2023-02-04 DIAGNOSIS — H524 Presbyopia: Secondary | ICD-10-CM | POA: Diagnosis not present

## 2023-02-22 DIAGNOSIS — H25043 Posterior subcapsular polar age-related cataract, bilateral: Secondary | ICD-10-CM | POA: Diagnosis not present

## 2023-02-22 DIAGNOSIS — H25013 Cortical age-related cataract, bilateral: Secondary | ICD-10-CM | POA: Diagnosis not present

## 2023-02-22 DIAGNOSIS — I10 Essential (primary) hypertension: Secondary | ICD-10-CM | POA: Diagnosis not present

## 2023-02-22 DIAGNOSIS — H2511 Age-related nuclear cataract, right eye: Secondary | ICD-10-CM | POA: Diagnosis not present

## 2023-02-22 DIAGNOSIS — H2513 Age-related nuclear cataract, bilateral: Secondary | ICD-10-CM | POA: Diagnosis not present

## 2023-03-30 DIAGNOSIS — E7800 Pure hypercholesterolemia, unspecified: Secondary | ICD-10-CM | POA: Diagnosis not present

## 2023-03-30 DIAGNOSIS — R7303 Prediabetes: Secondary | ICD-10-CM | POA: Diagnosis not present

## 2023-04-06 DIAGNOSIS — G8929 Other chronic pain: Secondary | ICD-10-CM | POA: Diagnosis not present

## 2023-04-06 DIAGNOSIS — I10 Essential (primary) hypertension: Secondary | ICD-10-CM | POA: Diagnosis not present

## 2023-04-06 DIAGNOSIS — E559 Vitamin D deficiency, unspecified: Secondary | ICD-10-CM | POA: Diagnosis not present

## 2023-04-06 DIAGNOSIS — E7800 Pure hypercholesterolemia, unspecified: Secondary | ICD-10-CM | POA: Diagnosis not present

## 2023-04-06 DIAGNOSIS — R7303 Prediabetes: Secondary | ICD-10-CM | POA: Diagnosis not present

## 2023-04-06 DIAGNOSIS — G4700 Insomnia, unspecified: Secondary | ICD-10-CM | POA: Diagnosis not present

## 2023-04-06 DIAGNOSIS — E041 Nontoxic single thyroid nodule: Secondary | ICD-10-CM | POA: Diagnosis not present

## 2023-04-06 DIAGNOSIS — Z79891 Long term (current) use of opiate analgesic: Secondary | ICD-10-CM | POA: Diagnosis not present

## 2023-04-06 DIAGNOSIS — R1031 Right lower quadrant pain: Secondary | ICD-10-CM | POA: Diagnosis not present

## 2023-04-29 DIAGNOSIS — H2511 Age-related nuclear cataract, right eye: Secondary | ICD-10-CM | POA: Diagnosis not present

## 2023-04-30 DIAGNOSIS — H2512 Age-related nuclear cataract, left eye: Secondary | ICD-10-CM | POA: Diagnosis not present

## 2023-04-30 DIAGNOSIS — H25042 Posterior subcapsular polar age-related cataract, left eye: Secondary | ICD-10-CM | POA: Diagnosis not present

## 2023-04-30 DIAGNOSIS — H25012 Cortical age-related cataract, left eye: Secondary | ICD-10-CM | POA: Diagnosis not present

## 2023-05-27 DIAGNOSIS — H2512 Age-related nuclear cataract, left eye: Secondary | ICD-10-CM | POA: Diagnosis not present

## 2023-05-27 DIAGNOSIS — H25012 Cortical age-related cataract, left eye: Secondary | ICD-10-CM | POA: Diagnosis not present

## 2023-05-27 DIAGNOSIS — H25042 Posterior subcapsular polar age-related cataract, left eye: Secondary | ICD-10-CM | POA: Diagnosis not present

## 2023-06-07 DIAGNOSIS — M1611 Unilateral primary osteoarthritis, right hip: Secondary | ICD-10-CM | POA: Diagnosis not present

## 2023-06-07 DIAGNOSIS — S7002XA Contusion of left hip, initial encounter: Secondary | ICD-10-CM | POA: Diagnosis not present

## 2023-07-21 ENCOUNTER — Other Ambulatory Visit: Payer: Self-pay

## 2023-07-21 ENCOUNTER — Observation Stay (HOSPITAL_COMMUNITY)
Admission: EM | Admit: 2023-07-21 | Discharge: 2023-07-23 | Disposition: A | Discharge status: Home / Self Care | Attending: Internal Medicine | Admitting: Internal Medicine

## 2023-07-21 ENCOUNTER — Encounter (HOSPITAL_COMMUNITY): Payer: Self-pay | Admitting: *Deleted

## 2023-07-21 ENCOUNTER — Emergency Department (HOSPITAL_COMMUNITY)

## 2023-07-21 DIAGNOSIS — D509 Iron deficiency anemia, unspecified: Secondary | ICD-10-CM | POA: Diagnosis not present

## 2023-07-21 DIAGNOSIS — E785 Hyperlipidemia, unspecified: Secondary | ICD-10-CM | POA: Diagnosis not present

## 2023-07-21 DIAGNOSIS — I214 Non-ST elevation (NSTEMI) myocardial infarction: Principal | ICD-10-CM | POA: Diagnosis present

## 2023-07-21 DIAGNOSIS — Z79899 Other long term (current) drug therapy: Secondary | ICD-10-CM | POA: Diagnosis not present

## 2023-07-21 DIAGNOSIS — M79602 Pain in left arm: Secondary | ICD-10-CM | POA: Diagnosis not present

## 2023-07-21 DIAGNOSIS — I10 Essential (primary) hypertension: Secondary | ICD-10-CM | POA: Diagnosis not present

## 2023-07-21 DIAGNOSIS — M25512 Pain in left shoulder: Secondary | ICD-10-CM | POA: Diagnosis not present

## 2023-07-21 DIAGNOSIS — R9431 Abnormal electrocardiogram [ECG] [EKG]: Secondary | ICD-10-CM | POA: Diagnosis not present

## 2023-07-21 LAB — COMPREHENSIVE METABOLIC PANEL WITH GFR
ALT: 28 U/L (ref 0–44)
AST: 25 U/L (ref 15–41)
Albumin: 3.8 g/dL (ref 3.5–5.0)
Alkaline Phosphatase: 51 U/L (ref 38–126)
Anion gap: 10 (ref 5–15)
BUN: 15 mg/dL (ref 8–23)
CO2: 25 mmol/L (ref 22–32)
Calcium: 9.3 mg/dL (ref 8.9–10.3)
Chloride: 106 mmol/L (ref 98–111)
Creatinine, Ser: 0.9 mg/dL (ref 0.44–1.00)
GFR, Estimated: >60 mL/min (ref >60)
Glucose, Bld: 121 mg/dL — ABNORMAL HIGH (ref 70–99)
Potassium: 4.2 mmol/L (ref 3.5–5.1)
Sodium: 141 mmol/L (ref 135–145)
Total Bilirubin: 0.3 mg/dL (ref 0.0–1.2)
Total Protein: 7.3 g/dL (ref 6.5–8.1)

## 2023-07-21 LAB — CBC WITH DIFFERENTIAL/PLATELET
Abs Immature Granulocytes: 0.02 10*3/uL (ref 0.00–0.07)
Basophils Absolute: 0 10*3/uL (ref 0.0–0.1)
Basophils Relative: 0 %
Eosinophils Absolute: 0.1 10*3/uL (ref 0.0–0.5)
Eosinophils Relative: 1 %
HCT: 36.5 % (ref 36.0–46.0)
Hemoglobin: 11.7 g/dL — ABNORMAL LOW (ref 12.0–15.0)
Immature Granulocytes: 0 %
Lymphocytes Relative: 14 %
Lymphs Abs: 1.1 10*3/uL (ref 0.7–4.0)
MCH: 30 pg (ref 26.0–34.0)
MCHC: 32.1 g/dL (ref 30.0–36.0)
MCV: 93.6 fL (ref 80.0–100.0)
Monocytes Absolute: 0.5 10*3/uL (ref 0.1–1.0)
Monocytes Relative: 6 %
Neutro Abs: 6 10*3/uL (ref 1.7–7.7)
Neutrophils Relative %: 79 %
Platelets: 245 10*3/uL (ref 150–400)
RBC: 3.9 MIL/uL (ref 3.87–5.11)
RDW: 13.2 % (ref 11.5–15.5)
WBC: 7.6 10*3/uL (ref 4.0–10.5)
nRBC: 0 % (ref 0.0–0.2)

## 2023-07-21 LAB — TROPONIN I (HIGH SENSITIVITY)
Troponin I (High Sensitivity): 191 ng/L (ref <18)
Troponin I (High Sensitivity): 589 ng/L (ref <18)

## 2023-07-21 MED ORDER — IRBESARTAN 300 MG PO TABS: 300.0000 mg | ORAL_TABLET | Freq: Every day | ORAL | Status: DC

## 2023-07-21 MED ORDER — HEPARIN (PORCINE) 25000 UT/250ML-% IV SOLN: 12.0000 [IU]/kg/h | INTRAVENOUS | Status: DC

## 2023-07-21 MED ORDER — AMLODIPINE-OLMESARTAN 10-40 MG PO TABS: 1.0000 | ORAL_TABLET | Freq: Every morning | ORAL | Status: DC

## 2023-07-21 MED ORDER — HEPARIN BOLUS VIA INFUSION
4000.0000 [IU] | Freq: Once | INTRAVENOUS | Status: AC
  Administered 2023-07-21: 4000 [IU] via INTRAVENOUS
  Filled 2023-07-21: qty 4000

## 2023-07-21 MED ORDER — HEPARIN (PORCINE) 25000 UT/250ML-% IV SOLN
900.0000 [IU]/h | INTRAVENOUS | Status: DC
  Administered 2023-07-21: 800 [IU]/h via INTRAVENOUS
  Filled 2023-07-21: qty 250

## 2023-07-21 MED ORDER — ASPIRIN 81 MG PO TBEC
81.0000 mg | DELAYED_RELEASE_TABLET | Freq: Every day | ORAL | Status: DC
  Administered 2023-07-23: 81 mg via ORAL
  Filled 2023-07-21 (×2): qty 1

## 2023-07-21 MED ORDER — AMLODIPINE BESYLATE 10 MG PO TABS
10.0000 mg | ORAL_TABLET | Freq: Every day | ORAL | Status: DC
  Administered 2023-07-22 – 2023-07-23 (×2): 10 mg via ORAL
  Filled 2023-07-21: qty 1
  Filled 2023-07-21: qty 2
  Filled 2023-07-21: qty 1

## 2023-07-21 MED ORDER — ASPIRIN 81 MG PO CHEW
324.0000 mg | CHEWABLE_TABLET | Freq: Once | ORAL | Status: AC
  Administered 2023-07-21: 324 mg via ORAL
  Filled 2023-07-21: qty 4

## 2023-07-21 MED ORDER — NITROGLYCERIN 0.4 MG SL SUBL: 0.4000 mg | SUBLINGUAL_TABLET | SUBLINGUAL | Status: DC | PRN

## 2023-07-21 MED ORDER — HEPARIN SODIUM (PORCINE) 5000 UNIT/ML IJ SOLN: 4000.0000 [IU] | Freq: Once | INTRAMUSCULAR | Status: DC

## 2023-07-21 MED ORDER — METOPROLOL TARTRATE 25 MG PO TABS
25.0000 mg | ORAL_TABLET | Freq: Two times a day (BID) | ORAL | Status: DC
  Administered 2023-07-21 – 2023-07-23 (×4): 25 mg via ORAL
  Filled 2023-07-21 (×5): qty 1

## 2023-07-21 MED ORDER — ATORVASTATIN CALCIUM 80 MG PO TABS
80.0000 mg | ORAL_TABLET | Freq: Every day | ORAL | Status: DC
  Administered 2023-07-22 – 2023-07-23 (×2): 80 mg via ORAL
  Filled 2023-07-21 (×2): qty 1
  Filled 2023-07-21: qty 2

## 2023-07-21 NOTE — Progress Notes (Signed)
 ANTICOAGULATION CONSULT NOTE  Pharmacy Consult for Heparin Indication: chest pain/ACS  No Known Allergies  Patient Measurements: Height: 5' 4 (162.6 cm) Weight: 69.4 kg (153 lb) IBW/kg (Calculated) : 54.7 Heparin Dosing Weight: 68.7 kg  Vital Signs: Temp: 99.3 F (37.4 C) (06/25 1707) BP: 141/66 (06/25 1707) Pulse Rate: 89 (06/25 1707)  Labs: Recent Labs    07/21/23 1732  HGB 11.7*  HCT 36.5  PLT 245  CREATININE 0.90  TROPONINIHS 191*    Estimated Creatinine Clearance: 55.6 mL/min (by C-G formula based on SCr of 0.9 mg/dL).   Medical History: Past Medical History:  Diagnosis Date   Arthritis    right hip   Fibroid    TAH 7/96   Fibromuscular dysplasia of renal artery (HCC)    Right   Hypertension    Bilateral renal artery fibromuscular dysplasia   Osteoporosis    Thyroid  disease    hypothyroid, thyroid  goiter   Vitamin D  deficiency     Medications:  (Not in a hospital admission)  Scheduled:   aspirin  324 mg Oral Once   heparin  4,000 Units Intravenous Once   Infusions:   heparin     PRN: nitroGLYCERIN  Assessment: Joanne Duncan presenting with lt arm pain and lt flank pain. Heparin per pharmacy consult placed for chest pain/ACS.  Patient is not on anticoagulation prior to arrival.  Hgb 11.7; plt 245  Goal of Therapy:  Heparin level 0.3-0.7 units/ml Monitor platelets by anticoagulation protocol: Yes   Plan:  Give IV heparin 4000 units bolus x 1 Start heparin infusion at 800 units/hr Check anti-Xa level in 8 hours and daily while on heparin Continue to monitor H&H and platelets  Dorn Buttner, PharmD, BCPS 07/21/2023 8:57 PM ED Clinical Pharmacist -  [phone]

## 2023-07-21 NOTE — H&P (Signed)
 History and Physical    Joanne Duncan FMW:[id number] DOB: 1950/08/19 DOA: 07/21/2023  Patient coming from: Home.  Chief Complaint: Left arm pain.  HPI: Joanne Duncan is a 73 y.o. female with history of hypertension, fibromuscular dysplasia of the renal artery status post angioplasty started experiencing left arm pain while patient was shopping.  Denies any associated shortness of breath diaphoresis or any chest pain.  Patient went back home and took some Tylenol  despite which the pain was still persistent and decided to go to the urgent care.  At urgent care patient had an abnormal EKG and was referred to the ER.  ED Course: In the ER EKG shows normal sinus rhythm with RBBB.  Labs show troponin of 191 and repeat was 489.  Patient was given nitroglycerin and aspirin.  Following nitroglycerin the left arm pain improved.  Cardiology was consulted patient was started on heparin infusion admitted for non-ST elevation MI.  Review of Systems: As per HPI, rest all negative.   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 She has never been exposed to tobacco smoke. She has never used smokeless tobacco. She reports current alcohol use. She reports that she does not use drugs.  No Known Allergies  Family History  Problem Relation Age of Onset   Breast cancer Sister 47   Breast cancer Sister 64       breast cancer recurrence    Prior to Admission medications    Medication Sig Start Date End Date Taking? Authorizing Provider  amLODipine-olmesartan (AZOR) 10-40 MG tablet Take 1 tablet by mouth every morning.   Yes [provider]  Cholecalciferol (VITAMIN D3) 125 MCG (5000 UT) TABS Take 1 tablet by mouth daily.   Yes [provider]  Ferrous Sulfate (IRON) 325 (65 Fe) MG TABS Take by mouth.   Yes [provider]  HYDROcodone -acetaminophen  (NORCO/VICODIN) 5-325 MG per tablet Take 2 tablets by mouth every 4 (four) hours as needed. 08/03/14  Yes Tapia, Leisa, PA-C  metoprolol-hydrochlorothiazide (LOPRESSOR HCT) 50-25 MG tablet Take 1 tablet by mouth at bedtime. 07/07/21  Yes [provider]  Multiple Vitamin (MULTIVITAMIN) tablet Take 1 tablet by mouth daily.   Yes [provider]  naproxen sodium (ALEVE) 220 MG tablet Take 440 mg by mouth 2 (two) times daily as needed (muscle and pain from a fall).   Yes [provider]  Propylene Glycol (SYSTANE COMPLETE) 0.6 % SOLN Place 1 drop into both eyes as needed (dry eyes).   Yes [provider]  tiZANidine (ZANAFLEX) 2 MG tablet Take 4 mg by mouth at bedtime as needed for muscle spasms.   Yes [provider]    Physical Exam: Constitutional: Moderately built and nourished. Vitals:   07/21/23 1723 07/21/23 2100 07/21/23 2154 07/21/23 2226  BP:  138/76  132/69  Pulse:  81  84  Resp:  15    Temp:   98.6 F (37 C)   TempSrc:   Oral   SpO2:  100%    Weight: 69.4 kg     Height: 5' 4 (1.626 m)      Eyes: Anicteric no pallor. ENMT: No discharge from the ears eyes nose normal. Neck: No mass felt.  No neck rigidity. Respiratory: No rhonchi or crepitations. Cardiovascular: S1-S2 heard. Abdomen: Soft nontender bowel sound present. Musculoskeletal: No edema. Skin: No rash. Neurologic: Alert awake oriented time place and person.  Moves all extremities. Psychiatric: Appears normal.  Normal affect.   Labs on Admission: I have personally  reviewed following labs and imaging studies  CBC: Recent Labs  Lab 07/21/23 1732  WBC 7.6  NEUTROABS 6.0  HGB 11.7*  HCT 36.5  MCV 93.6  PLT 245   Basic Metabolic Panel: Recent Labs  Lab 07/21/23 1732  NA 141  K 4.2  CL 106  CO2 25  GLUCOSE 121*  BUN 15  CREATININE 0.90  CALCIUM 9.3   GFR: Estimated Creatinine Clearance: 55.6 mL/min (by C-G formula based on SCr of 0.9 mg/dL). Liver Function Tests: Recent Labs  Lab 07/21/23 1732  AST 25  ALT 28  ALKPHOS 51  BILITOT 0.3  PROT 7.3  ALBUMIN 3.8   No results for input(s): LIPASE, AMYLASE in the last 168 hours. No results for input(s): AMMONIA in the last 168 hours. Coagulation Profile: No results for input(s): INR, PROTIME in the last 168 hours. Cardiac Enzymes: No results for input(s): CKTOTAL, CKMB, CKMBINDEX, TROPONINI in the last 168 hours. BNP (last 3 results) No results for input(s): PROBNP in the last 8760 hours. HbA1C: No results for input(s): HGBA1C in the last 72 hours. CBG: No results for input(s): GLUCAP in the last 168 hours. Lipid Profile: No results for input(s): CHOL, HDL, LDLCALC, TRIG, CHOLHDL, LDLDIRECT in the last 72 hours. Thyroid  Function Tests: No results for input(s): TSH, T4TOTAL, FREET4, T3FREE, THYROIDAB in the last 72 hours. Anemia Panel: No results for input(s): VITAMINB12, FOLATE, FERRITIN, TIBC, IRON, RETICCTPCT in the last 72 hours. Urine analysis:    Component Value Date/Time   BILIRUBINUR neg 05/24/2015 0920   PROTEINUR neg 05/24/2015 0920   UROBILINOGEN negative 05/24/2015 0920   NITRITE neg 05/24/2015 0920   LEUKOCYTESUR Negative 05/24/2015 0920   Sepsis Labs: @LABRCNTIP (procalcitonin:4,lacticidven:4) )No results found for this or any previous visit (from the past 240 hours).   Radiological Exams on Admission: DG Chest 2 View Addendum Date: 07/21/2023 ADDENDUM REPORT: 07/21/2023 19:23 Electronically Signed    By: Lynwood Landy Raddle M.D.   On: 07/21/2023 19:23   Result Date: 07/21/2023 CLINICAL DATA:  Left arm pain, abnormal EKG. EXAM: CHEST - 2 VIEW COMPARISON:  September 03, 2012. FINDINGS: The heart size and mediastinal contours are within normal limits. Both lungs are clear. The visualized skeletal structures are unremarkable. IMPRESSION: No active cardiopulmonary disease. Electronically Signed: By: Lynwood Landy Raddle M.D. On: 07/21/2023 18:57    EKG: Independently reviewed.  Normal sinus rhythm right bundle branch block.  Assessment/Plan Principal Problem:   Non-STEMI (non-ST elevated myocardial infarction) (HCC) Active Problems:   HYPERTENSION, BENIGN SYSTEMIC   Iron deficiency anemia    Non-ST elevation MI -    discussed with cardiologist.  Patient has been started on heparin infusion.  On antiplatelet agents.  High-dose statins beta-blockers n.p.o. after midnight in anticipation of cardiac cath.  Check 2D echo. Hypertension will continue ARB amlodipine and beta-blocker.  Hold hydrochlorothiazide. Anemia follow  CBC.  On iron supplements.  Since patient has non-ST elevation MI will need further workup and more than 2 midnight stay.   DVT prophylaxis: Heparin infusion. Code Status: Full code. Family Communication: Discussed with patient. Disposition Plan: Cardiac telemetry. Consults called: Cardiology. Admission status: Observation.

## 2023-07-21 NOTE — ED Triage Notes (Signed)
 The pt has had lt arm pain and lt flank pain that started around 1200  she went to [facility] and they did an EKG and told her that it was abnormal and to come here

## 2023-07-21 NOTE — ED Provider Notes (Signed)
 [place] EMERGENCY DEPARTMENT AT [facility] Provider Note   CSN: [id number] Arrival date & time: 07/21/23  1643     Patient presents with: No chief complaint on file.   Joanne Duncan is a 73 y.o. female who presents for evaluation of Left arm pain and and abnormal EKG.  Patient states that she was at a shopping center when she had onset of pain in her left arm.  She describes the pain as aching.  She states it then went into her left shoulder blade.  Patient states that it is not worse with movement.  She denies nausea vomiting diaphoresis or shortness of breath.  She went home and took 2 Tylenol  but nothing got better.  She then went to [facility] where she had an EKG done and was noted to have a bundle branch block.  She was sent into the emergency department for further eval.  Has a history of prediabetes and hypertension.  She also has a history of renal artery stenosis with a stent   HPI     Prior to Admission medications   Medication Sig Start Date End Date Taking? Authorizing Provider  amLODipine -olmesartan  (AZOR ) 10-40 MG tablet Take 1 tablet by mouth every morning.    [provider]  ascorbic acid (VITAMIN C) 500 MG tablet Take 500 mg by mouth daily.    [provider]  Cholecalciferol (VITAMIN D3) 1.25 MG (50000 UT) TABS Take by mouth.    [provider]  Ferrous Sulfate (IRON) 325 (65 Fe) MG TABS Take by mouth.    [provider]  HYDROcodone -acetaminophen  (NORCO/VICODIN) 5-325 MG per tablet Take 2 tablets by mouth every 4 (four) hours as needed. 08/03/14   Tapia, Leisa, PA-C  metoprolol -hydrochlorothiazide (LOPRESSOR  HCT) 50-25 MG tablet Take 1 tablet by mouth daily. 07/07/21   [provider]  Multiple Vitamin (MULTIVITAMIN) tablet Take 1 tablet by mouth daily.    [provider]    Allergies: Patient has no known allergies.    Review of Systems  Updated Vital Signs BP (!) 141/66 (BP Location: Left Arm)    Pulse 89   Temp 99.3 F (37.4 C)   Resp 17   Ht 5' 4 (1.626 m)   Wt 69.4 kg   LMP 07/27/1994 (Approximate)   SpO2 100%   BMI 26.26 kg/m   Physical Exam Vitals and nursing note reviewed.  Constitutional:      General: She is not in acute distress.    Appearance: She is well-developed. She is not diaphoretic.  HENT:     Head: Normocephalic and atraumatic.     Right Ear: External ear normal.     Left Ear: External ear normal.     Nose: Nose normal.     Mouth/Throat:     Mouth: Mucous membranes are moist.   Eyes:     General: No scleral icterus.    Conjunctiva/sclera: Conjunctivae normal.    Cardiovascular:     Rate and Rhythm: Normal rate and regular rhythm.     Heart sounds: Normal heart sounds. No murmur heard.    No friction rub. No gallop.  Pulmonary:     Effort: Pulmonary effort is normal. No respiratory distress.     Breath sounds: Normal breath sounds.  Abdominal:     General: Bowel sounds are normal. There is no distension.     Palpations: Abdomen is soft. There is no mass.     Tenderness: There is no abdominal tenderness. There  is no guarding.   Musculoskeletal:     Cervical back: Normal range of motion.     Comments: No shoulder pain or tenderness   Skin:    General: Skin is warm and dry.   Neurological:     Mental Status: She is alert and oriented to person, place, and time.   Psychiatric:        Behavior: Behavior normal.     (all labs ordered are listed, but only abnormal results are displayed) Labs Reviewed  CBC WITH DIFFERENTIAL/PLATELET - Abnormal; Notable for the following components:      Result Value   Hemoglobin 11.7 (*)    All other components within normal limits  COMPREHENSIVE METABOLIC PANEL WITH GFR - Abnormal; Notable for the following components:   Glucose, Bld 121 (*)    All other components within normal limits  TROPONIN I (HIGH SENSITIVITY) - Abnormal; Notable for the following components:   Troponin I (High Sensitivity)  191 (*)    All other components within normal limits  TROPONIN I (HIGH SENSITIVITY)    EKG: None  Radiology: DG Chest 2 View Addendum Date: 07/21/2023 ADDENDUM REPORT: 07/21/2023 19:23 Electronically Signed   By: Lynwood Landy Raddle M.D.   On: 07/21/2023 19:23   Result Date: 07/21/2023 CLINICAL DATA:  Left arm pain, abnormal EKG. EXAM: CHEST - 2 VIEW COMPARISON:  September 03, 2012. FINDINGS: The heart size and mediastinal contours are within normal limits. Both lungs are clear. The visualized skeletal structures are unremarkable. IMPRESSION: No active cardiopulmonary disease. Electronically Signed: By: Lynwood Landy Raddle M.D. On: 07/21/2023 18:57     .Critical Care  Performed by: Arloa Chroman, PA-C Authorized by: Arloa Chroman, PA-C   Critical care provider statement:    Critical care time (minutes):  60   Critical care time was exclusive of:  Separately billable procedures and treating other patients   Critical care was necessary to treat or prevent imminent or life-threatening deterioration of the following conditions:  Cardiac failure   Critical care was time spent personally by me on the following activities:  Development of treatment plan with patient or surrogate, discussions with consultants, evaluation of patient's response to treatment, examination of patient, ordering and review of laboratory studies, ordering and review of radiographic studies, ordering and performing treatments and interventions, pulse oximetry, re-evaluation of patient's condition and review of old charts    Medications Ordered in the ED - No data to display  Clinical Course as of 07/21/23 2114  Wed Jul 21, 2023  2112 Case discussed with Dr. Winfred of Cardiology. He will  consult on the patient . Recommends heparin  and admission [AH]    Clinical Course User Index [AH] Arloa Chroman, PA-C                                 Medical Decision Making Amount and/or Complexity of Data Reviewed Labs:  ordered.  Risk OTC drugs. Decision regarding hospitalization.   This patient presents to the ED for concern of left arm pain and back pain, this involves an extensive number of treatment options, and is a complaint that carries with it a high risk of complications and morbidity.  Differential diagnosis includes atypical MI presentation, musculoskeletal pain, esthesia, cervical radiculopathy  Co morbidities:   has a past medical history of Arthritis, Fibroid, Fibromuscular dysplasia of renal artery (HCC), Hypertension, Osteoporosis, Thyroid  disease, and Vitamin D  deficiency.   Social Determinants  of Health:   SDOH Screenings   Food Insecurity: No Food Insecurity (07/22/2023)  Housing: Low Risk  (07/22/2023)  Transportation Needs: No Transportation Needs (07/22/2023)  Utilities: Not At Risk (07/22/2023)  Social Connections: Moderately Isolated (07/22/2023)  Tobacco Use: Low Risk  (07/21/2023)     Additional history:  {Additional history obtained from family at bedside   Lab Tests:  I Ordered, and personally interpreted labs.  The pertinent results include:   Labs reviewed.  Elevated troponin level, mild anemia of insignificant value, CMP with mildly elevated blood glucose of 121  Imaging Studies:  I ordered imaging studies including chest x-ray I independently visualized and interpreted imaging which showed no acute findings I agree with the radiologist interpretation  Cardiac Monitoring/ECG:  The patient was maintained on a cardiac monitor.  I personally viewed and interpreted the cardiac monitored which showed an underlying rhythm of: Normal sinus rhythm with bundle branch block  Medicines ordered and prescription drug management:  I ordered medication including  Medications  aspirin  chewable tablet 324 mg (has no administration in time range)  nitroGLYCERIN  (NITROSTAT ) SL tablet 0.4 mg (has no administration in time range)  heparin  bolus via infusion 4,000 Units (has  no administration in time range)  heparin  ADULT infusion 100 units/mL (25000 units/250mL) (has no administration in time range)   for NSTEMI Reevaluation of the patient after these medicines showed that the patient improved I have reviewed the patients home medicines and have made adjustments as needed  Test Considered:    Critical Interventions:   IV heparin , aspirin , nitroglycerin   Consultations Obtained: Case discussed with on-call cardiology who recommends admission  Problem List / ED Course:     ICD-10-CM   1. NSTEMI (non-ST elevated myocardial infarction) (HCC)  I21.4       MDM: Patient here with left arm pain, elevated troponin.  Symptoms consistent with atypical NSTEMI presentation.   Dispostion:  After consideration of the diagnostic results and the patients response to treatment, I feel that the patent would benefit from admission.      Final diagnoses:  NSTEMI (non-ST elevated myocardial infarction) [facility])    ED Discharge Orders     None          Arloa Chroman, PA-C 07/22/23 2152    Elnor Savant A, DO 07/25/23 [phone]

## 2023-07-21 NOTE — ED Provider Triage Note (Signed)
 Emergency Medicine Provider Triage Evaluation Note  Joanne Duncan , a 73 y.o. female  was evaluated in triage.  Pt complains of left arm pain x noon today. She went to [facility], where they performed an EKG and indicated this was abnormal.  She does report some tightness along the left shoulder.  No chest pain, no shortness of breath.  However, according to urgent care note they were worried about cardiac etiology.  Review of Systems  Positive: Left arm pain Negative: Chest pain, shortness of breath  Physical Exam  BP (!) 141/66 (BP Location: Left Arm)   Pulse 89   Temp 99.3 F (37.4 C)   Resp 17   Ht 5' 4 (1.626 m)   Wt 69.4 kg   LMP 07/27/1994 (Approximate)   SpO2 100%   BMI 26.26 kg/m  Gen:   Awake, no distress   Resp:  Normal effort  MSK:   Moves extremities without difficulty  Other:    Medical Decision Making  Medically screening exam initiated at 5:27 PM.  Appropriate orders placed.  ABIE KILLIAN was informed that the remainder of the evaluation will be completed by another provider, this initial triage assessment does not replace that evaluation, and the importance of remaining in the ED until their evaluation is complete.     Lamiyah Schlotter, PA-C 07/21/23 1731

## 2023-07-21 NOTE — Consult Note (Signed)
 Cardiology Consultation   Patient ID: Joanne Duncan MRN: [id number]; DOB: Sep 19, 1950  Admit date: 07/21/2023 Date of Consult: 07/21/2023  PCP:  Corlis Pagan, NP   [facility] Providers Cardiologist:  None        Patient Profile: Joanne Duncan is a 73 y.o. female with a hx of hypertension, lipidemia, thyroid  nodule, prediabetes, chronic pain who is being seen 07/21/2023 for the evaluation of chest pain at the request of ER.  History of Present Illness: Joanne Duncan is a 73 y.o. 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  Patient reports left arm pain since noon, also left scapula pain-> she was initially at a shopping mall when pain started they went home took Tylenol  but did not really just went to urgent care/[facility] physician.  EKG was reportedly abnormal and sent her to the ED for further evaluation. Denies any chest pain but mostly complains of left arm pain and left scapular pain which is it is dull aching radiating to her left shoulder blade, nonexertional, no change in movement, no associated symptoms.  Never had prior chest pain before At [facility] physician EKG showed bundle branch block-thus sent here for further evaluation.   Inocente mains very functional, no alcohol, drug use or smoking. No family history of premature CAD  In the ER blood pressure normal, EKG shows right bundle branch block, left intrafascicular block. Troponin first 1 was 191 increased to 589.  She was given aspirin, nitroglycerin and heparin drip was started initiated.   Past Medical History:  Diagnosis Date   Arthritis    right hip   Fibroid    TAH 7/96   Fibromuscular dysplasia of renal artery (HCC)    Right   Hypertension    Bilateral renal artery fibromuscular  dysplasia   Osteoporosis    Thyroid  disease    hypothyroid, thyroid  goiter   Vitamin D  deficiency     Past Surgical History:  Procedure Laterality Date   ABDOMINAL HYSTERECTOMY  7/96   ovaries remain, done secondary to  fibroids   Angioplasty of Right Renal Artery  01-16-2010   Recurrent/residual fibromuscular dysplasia     Home Medications:  Prior to Admission medications   Medication Sig Start Date End Date Taking? Authorizing Provider  amLODipine-olmesartan (AZOR) 10-40 MG tablet Take 1 tablet by mouth every morning.   Yes [provider]  Cholecalciferol (VITAMIN D3) 125 MCG (5000 UT) TABS Take 1 tablet by mouth daily.   Yes [provider]  Ferrous Sulfate (IRON) 325 (65 Fe) MG TABS Take by mouth.   Yes [provider]  HYDROcodone -acetaminophen  (NORCO/VICODIN) 5-325 MG per tablet Take 2 tablets by mouth every 4 (four) hours as needed. 08/03/14  Yes Tapia, Leisa, PA-C  metoprolol-hydrochlorothiazide (LOPRESSOR HCT) 50-25 MG tablet Take 1 tablet by mouth at bedtime. 07/07/21  Yes [provider]  Multiple Vitamin (MULTIVITAMIN) tablet Take 1 tablet by mouth daily.   Yes [provider]  naproxen sodium (ALEVE) 220 MG tablet Take 440 mg by mouth 2 (two) times daily as needed (muscle and pain from a fall).   Yes [provider]  Propylene Glycol (SYSTANE COMPLETE) 0.6 % SOLN Place 1 drop into both eyes as needed (dry eyes).   Yes [provider]  tiZANidine (ZANAFLEX) 2 MG tablet Take 4  mg by mouth at bedtime as needed for muscle spasms.   Yes [provider]    Scheduled Meds:  Continuous Infusions:  heparin 800 Units/hr (07/21/23 2109)   PRN Meds: nitroGLYCERIN  Allergies:   No Known Allergies  Social History:   Social History   Socioeconomic History   Marital status: Divorced    Spouse name: Not on file   Number of children: 1   Years of education: Not on file   Highest education level: Not on  file  Occupational History   Occupation: ASSEMBLY    Employer: [employer]  Tobacco Use   Smoking status: Never    Passive exposure: Never   Smokeless tobacco: Never  Vaping Use   Vaping status: Never Used  Substance and Sexual Activity   Alcohol use: Yes    Comment: occasional wine   Drug use: No   Sexual activity: Not Currently    Partners: Male    Birth control/protection: Surgical    Comment: hysterectomy, menarche 73yo, sexual debut before age 37  Other Topics Concern   Not on file  Social History Narrative   Works at [employer].  Divorced and has 1 son.   Social Drivers of Corporate investment banker Strain: Not on file  Food Insecurity: Not on file  Transportation Needs: Not on file  Physical Activity: Not on file  Stress: Not on file  Social Connections: Not on file  Intimate Partner Violence: Not on file    Family History:    Family History  Problem Relation Age of Onset   Breast cancer Sister 60   Breast cancer Sister 87       breast cancer recurrence     ROS:  Please see the history of present illness.   All other ROS reviewed and negative.     Physical Exam/Data: Vitals:   07/21/23 1707 07/21/23 1723 07/21/23 2100  BP: (!) 141/66  138/76  Pulse: 89  81  Resp: 17  15  Temp: 99.3 F (37.4 C)    SpO2: 100%  100%  Weight:  69.4 kg   Height:  5' 4 (1.626 m)    No intake or output data in the 24 hours ending 07/21/23 2151    07/21/2023    5:23 PM 08/26/2022    7:53 AM 07/31/2021    8:15 AM  Last 3 Weights  Weight (lbs) 153 lb 153 lb 142 lb  Weight (kg) 69.4 kg 69.4 kg 64.411 kg     Body mass index is 26.26 kg/m.  General:  Well nourished, well developed, in no acute distress HEENT: normal Neck: no JVD Vascular: No carotid bruits; Distal pulses 2+ bilaterally Cardiac:  normal S1, S2; RRR; no murmur   Lungs:  clear to auscultation bilaterally, no wheezing, rhonchi or rales  Abd: soft, nontender, no hepatomegaly  Ext: no  edema Musculoskeletal:  No deformities, BUE and BLE strength normal and equal Skin: warm and dry  Neuro:  CNs 2-12 intact, no focal abnormalities noted Psych:  Normal affect    Laboratory Data: High Sensitivity Troponin:   Recent Labs  Lab 07/21/23 1732 07/21/23 1932  TROPONINIHS 191* 589*     Chemistry Recent Labs  Lab 07/21/23 1732  NA 141  K 4.2  CL 106  CO2 25  GLUCOSE 121*  BUN 15  CREATININE 0.90  CALCIUM 9.3  GFRNONAA >60  ANIONGAP 10    Recent Labs  Lab 07/21/23 1732  PROT 7.3  ALBUMIN 3.8  AST 25  ALT 28  ALKPHOS 51  BILITOT 0.3   Lipids No results for input(s): CHOL, TRIG, HDL, LABVLDL, LDLCALC, CHOLHDL in the last 168 hours.  Hematology Recent Labs  Lab 07/21/23 1732  WBC 7.6  RBC 3.90  HGB 11.7*  HCT 36.5  MCV 93.6  MCH 30.0  MCHC 32.1  RDW 13.2  PLT 245   Thyroid  No results for input(s): TSH, FREET4 in the last 168 hours.  BNPNo results for input(s): BNP, PROBNP in the last 168 hours.  DDimer No results for input(s): DDIMER in the last 168 hours.  Radiology/Studies:  DG Chest 2 View Addendum Date: 07/21/2023 ADDENDUM REPORT: 07/21/2023 19:23 Electronically Signed   By: Lynwood Landy Raddle M.D.   On: 07/21/2023 19:23   Result Date: 07/21/2023 CLINICAL DATA:  Left arm pain, abnormal EKG. EXAM: CHEST - 2 VIEW COMPARISON:  September 03, 2012. FINDINGS: The heart size and mediastinal contours are within normal limits. Both lungs are clear. The visualized skeletal structures are unremarkable. IMPRESSION: No active cardiopulmonary disease. Electronically Signed: By: Lynwood Landy Raddle M.D. On: 07/21/2023 18:57     Assessment and Plan: Chest pain/NSTEMI Hypertension, hyperlipidemia, prediabetes. History of renal artery stenosis/fibromuscular dysplasia status post bilateral angioplasty 2011 Chronic pain syndrome  Plan: -> IV heparin GTT, trend troponin, EKG. N.p.o. after midnight for LHC/cath in a.m. -Continue aspirin,  high-dose atorvastatin 80 mg, initiate low-dose metoprolol tartrate 25 mg twice daily.  Will check lipid panel and A1c in a.m. Obtain echo in a.m. Restart home blood pressure medications, hold HCTZ. Will follow along   Risk Assessment/Risk Scores:    TIMI Risk Score for Unstable Angina or Non-ST Elevation MI:   The patient's TIMI risk score is 5, which indicates a 26% risk of all cause mortality, new or recurrent myocardial infarction or need for urgent revascularization in the next 14 days.         For questions or updates, please contact [facility] Please consult [email or web address] for contact info under    Signed, Grayce Bold, MD  07/21/2023 9:51 PM

## 2023-07-22 ENCOUNTER — Observation Stay (HOSPITAL_COMMUNITY)

## 2023-07-22 ENCOUNTER — Encounter (HOSPITAL_COMMUNITY)
Admission: EM | Disposition: A | Discharge status: Home / Self Care | Payer: Self-pay | Source: Home / Self Care | Attending: Emergency Medicine

## 2023-07-22 DIAGNOSIS — I249 Acute ischemic heart disease, unspecified: Secondary | ICD-10-CM | POA: Diagnosis not present

## 2023-07-22 DIAGNOSIS — I4510 Unspecified right bundle-branch block: Secondary | ICD-10-CM | POA: Diagnosis not present

## 2023-07-22 DIAGNOSIS — I10 Essential (primary) hypertension: Secondary | ICD-10-CM | POA: Diagnosis not present

## 2023-07-22 DIAGNOSIS — I214 Non-ST elevation (NSTEMI) myocardial infarction: Secondary | ICD-10-CM | POA: Diagnosis not present

## 2023-07-22 DIAGNOSIS — I219 Acute myocardial infarction, unspecified: Secondary | ICD-10-CM

## 2023-07-22 HISTORY — PX: LEFT HEART CATH AND CORONARY ANGIOGRAPHY: CATH118249

## 2023-07-22 HISTORY — DX: Acute myocardial infarction, unspecified: I21.9

## 2023-07-22 LAB — ECHOCARDIOGRAM COMPLETE
AR max vel: 2.77 cm2
AV Area VTI: 2.93 cm2
AV Area mean vel: 2.78 cm2
AV Mean grad: 6 mmHg
AV Peak grad: 11.9 mmHg
Ao pk vel: 1.73 m/s
Area-P 1/2: 4.49 cm2
Height: 64 in
S' Lateral: 2.8 cm
Weight: 2447.99 [oz_av]

## 2023-07-22 LAB — HIV ANTIBODY (ROUTINE TESTING W REFLEX): HIV Screen 4th Generation wRfx: NONREACTIVE

## 2023-07-22 LAB — COMPREHENSIVE METABOLIC PANEL WITH GFR
ALT: 26 U/L (ref 0–44)
AST: 30 U/L (ref 15–41)
Albumin: 3.4 g/dL — ABNORMAL LOW (ref 3.5–5.0)
Alkaline Phosphatase: 49 U/L (ref 38–126)
Anion gap: 11 (ref 5–15)
BUN: 9 mg/dL (ref 8–23)
CO2: 23 mmol/L (ref 22–32)
Calcium: 8.7 mg/dL — ABNORMAL LOW (ref 8.9–10.3)
Chloride: 106 mmol/L (ref 98–111)
Creatinine, Ser: 0.65 mg/dL (ref 0.44–1.00)
GFR, Estimated: >60 mL/min (ref >60)
Glucose, Bld: 107 mg/dL — ABNORMAL HIGH (ref 70–99)
Potassium: 3.8 mmol/L (ref 3.5–5.1)
Sodium: 140 mmol/L (ref 135–145)
Total Bilirubin: 0.9 mg/dL (ref 0.0–1.2)
Total Protein: 6.5 g/dL (ref 6.5–8.1)

## 2023-07-22 LAB — LIPID PANEL
Cholesterol: 213 mg/dL — ABNORMAL HIGH (ref 0–200)
HDL: 73 mg/dL (ref >40)
LDL Cholesterol: 119 mg/dL — ABNORMAL HIGH (ref 0–99)
Total CHOL/HDL Ratio: 2.9 ratio
Triglycerides: 104 mg/dL (ref <150)
VLDL: 21 mg/dL (ref 0–40)

## 2023-07-22 LAB — HEMOGLOBIN A1C
Hgb A1c MFr Bld: 6 % — ABNORMAL HIGH (ref 4.8–5.6)
Mean Plasma Glucose: 125.5 mg/dL

## 2023-07-22 LAB — CBC
HCT: 34.7 % — ABNORMAL LOW (ref 36.0–46.0)
Hemoglobin: 11.3 g/dL — ABNORMAL LOW (ref 12.0–15.0)
MCH: 29.9 pg (ref 26.0–34.0)
MCHC: 32.6 g/dL (ref 30.0–36.0)
MCV: 91.8 fL (ref 80.0–100.0)
Platelets: 226 10*3/uL (ref 150–400)
RBC: 3.78 MIL/uL — ABNORMAL LOW (ref 3.87–5.11)
RDW: 13.2 % (ref 11.5–15.5)
WBC: 6.5 10*3/uL (ref 4.0–10.5)
nRBC: 0 % (ref 0.0–0.2)

## 2023-07-22 LAB — HEPARIN LEVEL (UNFRACTIONATED)
Heparin Unfractionated: 0.31 [IU]/mL (ref 0.30–0.70)
Heparin Unfractionated: 0.31 [IU]/mL (ref 0.30–0.70)

## 2023-07-22 LAB — MRSA NEXT GEN BY PCR, NASAL: MRSA by PCR Next Gen: NOT DETECTED

## 2023-07-22 LAB — MAGNESIUM: Magnesium: 2.1 mg/dL (ref 1.7–2.4)

## 2023-07-22 SURGERY — LEFT HEART CATH AND CORONARY ANGIOGRAPHY
Anesthesia: LOCAL

## 2023-07-22 MED ORDER — HEPARIN (PORCINE) IN NACL 1000-0.9 UT/500ML-% IV SOLN
INTRAVENOUS | Status: DC | PRN
  Administered 2023-07-22 (×2): 500 mL

## 2023-07-22 MED ORDER — TRAZODONE HCL 50 MG PO TABS
50.0000 mg | ORAL_TABLET | Freq: Every evening | ORAL | Status: DC | PRN
  Administered 2023-07-22: 50 mg via ORAL
  Filled 2023-07-22: qty 1

## 2023-07-22 MED ORDER — SODIUM CHLORIDE 0.9% FLUSH
3.0000 mL | Freq: Two times a day (BID) | INTRAVENOUS | Status: DC
  Administered 2023-07-22: 3 mL via INTRAVENOUS

## 2023-07-22 MED ORDER — HEPARIN SODIUM (PORCINE) 1000 UNIT/ML IJ SOLN
INTRAMUSCULAR | Status: AC
  Filled 2023-07-22: qty 10

## 2023-07-22 MED ORDER — SODIUM CHLORIDE 0.9 % IV SOLN: 250.0000 mL | INTRAVENOUS | Status: DC | PRN

## 2023-07-22 MED ORDER — SODIUM CHLORIDE 0.9 % WEIGHT BASED INFUSION: 3.0000 mL/kg/h | INTRAVENOUS | Status: DC

## 2023-07-22 MED ORDER — FENTANYL CITRATE (PF) 100 MCG/2ML IJ SOLN
INTRAMUSCULAR | Status: AC
Start: 2023-07-22 — End: 2023-07-22
  Filled 2023-07-22: qty 2

## 2023-07-22 MED ORDER — SODIUM CHLORIDE 0.9% FLUSH: 3.0000 mL | INTRAVENOUS | Status: DC | PRN

## 2023-07-22 MED ORDER — FENTANYL CITRATE (PF) 100 MCG/2ML IJ SOLN
INTRAMUSCULAR | Status: DC | PRN
  Administered 2023-07-22: 25 ug via INTRAVENOUS

## 2023-07-22 MED ORDER — LABETALOL HCL 5 MG/ML IV SOLN: 10.0000 mg | INTRAVENOUS | Status: AC | PRN

## 2023-07-22 MED ORDER — VERAPAMIL HCL 2.5 MG/ML IV SOLN
INTRAVENOUS | Status: AC
  Filled 2023-07-22: qty 2

## 2023-07-22 MED ORDER — MIDAZOLAM HCL 2 MG/2ML IJ SOLN
INTRAMUSCULAR | Status: DC | PRN
  Administered 2023-07-22: 1 mg via INTRAVENOUS

## 2023-07-22 MED ORDER — HYDRALAZINE HCL 20 MG/ML IJ SOLN: 10.0000 mg | INTRAMUSCULAR | Status: AC | PRN

## 2023-07-22 MED ORDER — VERAPAMIL HCL 2.5 MG/ML IV SOLN
INTRAVENOUS | Status: DC | PRN
  Administered 2023-07-22: 10 mL via INTRA_ARTERIAL

## 2023-07-22 MED ORDER — SODIUM CHLORIDE 0.9 % IV SOLN
INTRAVENOUS | Status: AC | PRN
  Administered 2023-07-22: 10 mL/h via INTRAVENOUS

## 2023-07-22 MED ORDER — ASPIRIN 81 MG PO CHEW: 81.0000 mg | CHEWABLE_TABLET | ORAL | Status: DC

## 2023-07-22 MED ORDER — MIDAZOLAM HCL 2 MG/2ML IJ SOLN
INTRAMUSCULAR | Status: AC
  Filled 2023-07-22: qty 2

## 2023-07-22 MED ORDER — ACETAMINOPHEN 325 MG PO TABS: 650.0000 mg | ORAL_TABLET | ORAL | Status: DC | PRN

## 2023-07-22 MED ORDER — LIDOCAINE HCL (PF) 1 % IJ SOLN
INTRAMUSCULAR | Status: AC
  Filled 2023-07-22: qty 30

## 2023-07-22 MED ORDER — SODIUM CHLORIDE 0.9 % WEIGHT BASED INFUSION: 1.0000 mL/kg/h | INTRAVENOUS | Status: DC

## 2023-07-22 MED ORDER — SODIUM CHLORIDE 0.9 % IV SOLN: INTRAVENOUS | Status: AC

## 2023-07-22 MED ORDER — ENOXAPARIN SODIUM 40 MG/0.4ML IJ SOSY
40.0000 mg | PREFILLED_SYRINGE | INTRAMUSCULAR | Status: DC
  Administered 2023-07-23: 40 mg via SUBCUTANEOUS
  Filled 2023-07-22: qty 0.4

## 2023-07-22 MED ORDER — HEPARIN SODIUM (PORCINE) 1000 UNIT/ML IJ SOLN
INTRAMUSCULAR | Status: DC | PRN
Start: 2023-07-22 — End: 2023-07-22
  Administered 2023-07-22: 3500 [IU] via INTRAVENOUS

## 2023-07-22 MED ORDER — IOHEXOL 350 MG/ML SOLN
INTRAVENOUS | Status: DC | PRN
  Administered 2023-07-22: 40 mL

## 2023-07-22 MED ORDER — LIDOCAINE HCL (PF) 1 % IJ SOLN
INTRAMUSCULAR | Status: DC | PRN
  Administered 2023-07-22: 2 mL via INTRADERMAL

## 2023-07-22 MED ORDER — ONDANSETRON HCL 4 MG/2ML IJ SOLN: 4.0000 mg | Freq: Four times a day (QID) | INTRAMUSCULAR | Status: DC | PRN

## 2023-07-22 SURGICAL SUPPLY — 8 items
CATH 5FR JL3.5 JR4 ANG PIG MP (CATHETERS) IMPLANT
DEVICE RAD TR BAND REGULAR (VASCULAR PRODUCTS) IMPLANT
GLIDESHEATH SLEND SS 6F .021 (SHEATH) IMPLANT
GUIDEWIRE INQWIRE 1.5J.035X260 (WIRE) IMPLANT
PACK CARDIAC CATHETERIZATION (CUSTOM PROCEDURE TRAY) ×1 IMPLANT
SET ATX-X65L (MISCELLANEOUS) IMPLANT
TRANSDUCER W/STOPCOCK (MISCELLANEOUS) IMPLANT
TUBING CIL FLEX 10 FLL-RA (TUBING) IMPLANT

## 2023-07-22 NOTE — Progress Notes (Addendum)
 ANTICOAGULATION CONSULT NOTE  Pharmacy Consult for Heparin Indication: chest pain/ACS  No Known Allergies  Patient Measurements: Height: 5' 4 (162.6 cm) Weight: 69.4 kg (153 lb) IBW/kg (Calculated) : 54.7 Heparin Dosing Weight: 68.7 kg  Vital Signs: Temp: 98.7 F (37.1 C) (06/26 0302) Temp Source: Oral (06/26 0302) BP: 125/62 (06/26 0515) Pulse Rate: 65 (06/26 0515)  Labs: Recent Labs    07/21/23 1732 07/21/23 1932 07/22/23 0521  HGB 11.7*  --  11.3*  HCT 36.5  --  34.7*  PLT 245  --  226  HEPARINUNFRC  --   --  0.31  CREATININE 0.90  --  0.65  TROPONINIHS 191* 589*  --     Estimated Creatinine Clearance: 62.6 mL/min (by C-G formula based on SCr of 0.65 mg/dL).   Medical History: Past Medical History:  Diagnosis Date   Arthritis    right hip   Fibroid    TAH 7/96   Fibromuscular dysplasia of renal artery (HCC)    Right   Hypertension    Bilateral renal artery fibromuscular dysplasia   Osteoporosis    Thyroid  disease    hypothyroid, thyroid  goiter   Vitamin D  deficiency      Assessment: 73 yo W with NSTEMI with troponin elevation. No anticoagulation prior to admission. Pharmacy consulted for heparin.    Heparin level 0.31 is at low end of therapeutic on 800 units/hr after bolus. CBC stable.  No issues with infusion or bleeding per RN.   Goal of Therapy:  Heparin level 0.3-0.7 units/ml Monitor platelets by anticoagulation protocol: Yes   Plan:  Increase heparin infusion to 900 units/hr F/u 8hr heparin level Monitor daily heparin level, CBC, signs/symptoms of bleeding   Jinnie Door, PharmD, BCPS, BCCP Clinical Pharmacist  Please check AMION for all [facility] Pharmacy phone numbers After 10:00 PM, call Main Pharmacy [phone]

## 2023-07-22 NOTE — Interval H&P Note (Signed)
 History and Physical Interval Note:  07/22/2023 3:50 PM  Joanne Duncan  has presented today for surgery, with the diagnosis of NSTEMI.  The various methods of treatment have been discussed with the patient and family. After consideration of risks, benefits and other options for treatment, the patient has consented to  Procedure(s): LEFT HEART CATH AND CORONARY ANGIOGRAPHY (N/A) as a surgical intervention.  The patient's history has been reviewed, patient examined, no change in status, stable for surgery.  I have reviewed the patient's chart and labs.  Questions were answered to the patient's satisfaction.    Cath Lab Visit (complete for each Cath Lab visit)  Clinical Evaluation Leading to the Procedure:   ACS: Yes.    Non-ACS:  N/A  Joanne Duncan

## 2023-07-22 NOTE — Plan of Care (Signed)
   Problem: Education: Goal: Knowledge of General Education information will improve Description: Including pain rating scale, medication(s)/side effects and non-pharmacologic comfort measures Outcome: Progressing   Problem: Health Behavior/Discharge Planning: Goal: Ability to manage health-related needs will improve Outcome: Progressing   Problem: Clinical Measurements: Goal: Will remain free from infection Outcome: Progressing

## 2023-07-22 NOTE — H&P (View-Only) (Signed)
 Rounding Note   Patient Name: Joanne Duncan Date of Encounter: 07/22/2023  [facility] Cardiologist: Joanne Lesches, MD   Subjective Reports she is doing okay this morning, ongoing left arm discomfort though improved since being placed on heparin.  Denies chest pain or pain to jaw. No SOB.   Stated that her arm discomfort ( throbbing) and back pain was her initial presentation.  No history of cardiac issues.  Scheduled Meds:  amLODipine  10 mg Oral Daily   aspirin EC  81 mg Oral Daily   atorvastatin  80 mg Oral Daily   metoprolol tartrate  25 mg Oral BID   Continuous Infusions:  heparin 900 Units/hr (07/22/23 0732)   PRN Meds: nitroGLYCERIN   Vital Signs  Vitals:   07/21/23 2226 07/22/23 0130 07/22/23 0302 07/22/23 0515  BP: 132/69 129/69  125/62  Pulse: 84 63  65  Resp:  13  18  Temp:   98.7 F (37.1 C)   TempSrc:   Oral   SpO2:  100%  100%  Weight:      Height:       No intake or output data in the 24 hours ending 07/22/23 0845    07/21/2023    5:23 PM 08/26/2022    7:53 AM 07/31/2021    8:15 AM  Last 3 Weights  Weight (lbs) 153 lb 153 lb 142 lb  Weight (kg) 69.4 kg 69.4 kg 64.411 kg      Telemetry Sinus with incomplete RBBB, occasional PVC and one missed beat HR 60-80 - Personally Reviewed  ECG  Sinus with new incomplete RBBB - Personally Reviewed  Physical Exam GEN: No acute distress walking in room.   Neck: No JVD Cardiac: RRR Respiratory: Breathing comfortably on room air. MS: No edema; No deformity. Neuro:  Nonfocal  Psych: Normal affect   Labs High Sensitivity Troponin:   Recent Labs  Lab 07/21/23 1732 07/21/23 1932  TROPONINIHS 191* 589*     Chemistry Recent Labs  Lab 07/21/23 1732 07/22/23 0521  NA 141 140  K 4.2 3.8  CL 106 106  CO2 25 23  GLUCOSE 121* 107*  BUN 15 9  CREATININE 0.90 0.65  CALCIUM 9.3 8.7*  PROT 7.3 6.5  ALBUMIN 3.8 3.4*  AST 25 30  ALT 28 26  ALKPHOS 51 49  BILITOT 0.3 0.9  GFRNONAA  >60 >60  ANIONGAP 10 11    Lipids  Recent Labs  Lab 07/21/23 1800  CHOL 213*  TRIG 104  HDL 73  LDLCALC 119*  CHOLHDL 2.9    Hematology Recent Labs  Lab 07/21/23 1732 07/22/23 0521  WBC 7.6 6.5  RBC 3.90 3.78*  HGB 11.7* 11.3*  HCT 36.5 34.7*  MCV 93.6 91.8  MCH 30.0 29.9  MCHC 32.1 32.6  RDW 13.2 13.2  PLT 245 226   Thyroid  No results for input(s): TSH, FREET4 in the last 168 hours.  BNPNo results for input(s): BNP, PROBNP in the last 168 hours.  DDimer No results for input(s): DDIMER in the last 168 hours.   Radiology  DG Chest 2 View Addendum Date: 07/21/2023 ADDENDUM REPORT: 07/21/2023 19:23 Electronically Signed   By: Lynwood Landy Raddle M.D.   On: 07/21/2023 19:23   Result Date: 07/21/2023 CLINICAL DATA:  Left arm pain, abnormal EKG. EXAM: CHEST - 2 VIEW COMPARISON:  September 03, 2012. FINDINGS: The heart size and mediastinal contours are within normal limits. Both lungs are clear. The visualized skeletal structures are unremarkable.  IMPRESSION: No active cardiopulmonary disease. Electronically Signed: By: Lynwood Landy Raddle M.D. On: 07/21/2023 18:57    Patient Profile   73 y.o. 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 She was recommended to present to the ED due to her ECG findings (new incomplete RBBB)  Assessment & Plan  NSTEMI Patient presented with atypical symptoms 07/21/23 including throbbing left arm and back pain. Denied chest pain. No prior history of cardiac issues. Denied prior anginal symptoms. Received ASA 325 and SL nitroglycerin on arrival to ED. Troponin (191->589)   ECG showed new incomplete RBBB CXR unremarkable On interview this morning, left arm pain is still present though improved.   Despite atypical symptoms, would like to pursue cardiac  catheterization today give positive troponins. Patient is prediabetic and has a history of hypertension.   Patient to remain NPO.  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 Chronic pain Will need to educate patient about avoiding NSAIDS pending catheterization results  Per primary Anemia     For questions or updates, please contact [facility] Please consult [email or web address] for contact info under     Signed, Joanne LOISE Salen, Joanne Duncan  07/22/2023, 8:45 AM    Agree with note by Joanne Salen, Joanne Duncan  Patient admitted with chest arm pain and non-STEMI.  Other problems include hyperlipidemia and hypertension.  No acute findings on her EKG although she does have a new right bundle branch block.  She is currently pain-free on IV heparin.  Her exam is benign.  She is scheduled for left heart cath later today.   I have reviewed the risks, indications, and alternatives to cardiac catheterization, possible angioplasty, and stenting with the patient. Risks include but are not limited to bleeding, infection, vascular injury, stroke, myocardial infection, arrhythmia, kidney injury, radiation-related injury in the case of prolonged fluoroscopy use, emergency cardiac surgery, and death. The patient understands the risks of serious complication is 1-2 in 1000 with diagnostic cardiac cath and 1-2% or less with angioplasty/stenting.    Joanne Duncan, M.D., FACP, [facility], FAHA, [facility]  [address] [place], [place]  [address]  [phone] 07/22/2023 10:41 AM

## 2023-07-22 NOTE — Progress Notes (Signed)
   Notified by RN that patient was tachycardic with HR up to the 150s on tele. When I reviewed telemetry, patient overall is maintaining NSR with HR in the 70s-80s. She appears to have brief episodes of SVT/atrial tachycardia. On interview, patient reports having occasional palpitations/fluttering feeling in her chest. Otherwise feeling well   Patient was started on metoprolol tartrate 25 mg BID, did not receive dose this AM. Asked RN to give dose of metoprolol. Continue to monitor on telemetry    Rollo FABIENE Louder, PA-C 07/22/2023 3:25 PM

## 2023-07-22 NOTE — Progress Notes (Signed)
*  PRELIMINARY RESULTS* Echocardiogram 2D Echocardiogram has been performed.  Benard FORBES Stallion 07/22/2023, 10:31 AM

## 2023-07-22 NOTE — Progress Notes (Signed)
 PROGRESS NOTE    STEPHANEE Duncan  FMW:[id number] DOB: 1950-07-31 DOA: 07/21/2023 PCP: Corlis Pagan, NP   Brief Narrative: 73 year old, PMH significant for fibromuscular dysplasia of the renal artery s/p angioplasty presents complaining of left arm pain.  He denies associated chest pain and shortness of breath.  He presented for evaluation of urgent care.  She had an abnormal EKG.  Evaluation in the ED EKG showed right bundle branch block, initial troponin 191 repeated 489.  Patient was given nitroglycerin and aspirin and started on heparin drip.  Cardiology has been consulted.  Assessment & Plan:   Principal Problem:   Non-STEMI (non-ST elevated myocardial infarction) (HCC) Active Problems:   HYPERTENSION, BENIGN SYSTEMIC   Iron deficiency anemia   1-Non-STEMI: Patient presented with left arm pain, abnormal EKG with right bundle branch block, elevation of troponin - Cardiology consulted, plan for heart cath today - Started on low-dose metoprolol, high-dose Lipitor.  Continue aspirin - Patient was started on heparin drip   Hypertension: - Continue amlodipine and beta-blocker. Hold hydrochlorothiazide, Avapro pre cath  Anemia: Continue iron supplement  HLD: LDL 119.  Started on high intensity statins.   Pre-DM;  Carb modified diet.   Estimated body mass index is 26.26 kg/m as calculated from the following:   Height as of this encounter: 5' 4 (1.626 m).   Weight as of this encounter: 69.4 kg.   DVT prophylaxis: Heparin  Code Status: Full code Family Communication: Family at bedside.  Disposition Plan:  Status is: Observation The patient will require care spanning > 2 midnights and should be moved to inpatient because: elevation of troponin     Consultants:  Cardiology   Procedures:  Cath   Antimicrobials:    Subjective: She is feeling better today. Denies left arm discomfort today   Objective: Vitals:   07/21/23 2226 07/22/23 0130 07/22/23 0302  07/22/23 0515  BP: 132/69 129/69  125/62  Pulse: 84 63  65  Resp:  13  18  Temp:   98.7 F (37.1 C)   TempSrc:   Oral   SpO2:  100%  100%  Weight:      Height:       No intake or output data in the 24 hours ending 07/22/23 0713 Filed Weights   07/21/23 1723  Weight: 69.4 kg    Examination:  General exam: Appears calm and comfortable  Respiratory system: Clear to auscultation. Respiratory effort normal. Cardiovascular system: S1 & S2 heard, RRR. No JVD, murmurs, rubs, gallops or clicks. No pedal edema. Gastrointestinal system: Abdomen is nondistended, soft and nontender. No organomegaly or masses felt. Normal bowel sounds heard. Central nervous system: Alert and oriented. No focal neurological deficits. Extremities: Symmetric 5 x 5 power.   Data Reviewed: I have personally reviewed following labs and imaging studies  CBC: Recent Labs  Lab 07/21/23 1732 07/22/23 0521  WBC 7.6 6.5  NEUTROABS 6.0  --   HGB 11.7* 11.3*  HCT 36.5 34.7*  MCV 93.6 91.8  PLT 245 226   Basic Metabolic Panel: Recent Labs  Lab 07/21/23 1732 07/22/23 0521  NA 141 140  K 4.2 3.8  CL 106 106  CO2 25 23  GLUCOSE 121* 107*  BUN 15 9  CREATININE 0.90 0.65  CALCIUM 9.3 8.7*   GFR: Estimated Creatinine Clearance: 62.6 mL/min (by C-G formula based on SCr of 0.65 mg/dL). Liver Function Tests: Recent Labs  Lab 07/21/23 1732 07/22/23 0521  AST 25 30  ALT 28 26  ALKPHOS  51 49  BILITOT 0.3 0.9  PROT 7.3 6.5  ALBUMIN 3.8 3.4*   No results for input(s): LIPASE, AMYLASE in the last 168 hours. No results for input(s): AMMONIA in the last 168 hours. Coagulation Profile: No results for input(s): INR, PROTIME in the last 168 hours. Cardiac Enzymes: No results for input(s): CKTOTAL, CKMB, CKMBINDEX, TROPONINI in the last 168 hours. BNP (last 3 results) No results for input(s): PROBNP in the last 8760 hours. HbA1C: Recent Labs    07/22/23 0521  HGBA1C 6.0*    CBG: No results for input(s): GLUCAP in the last 168 hours. Lipid Profile: Recent Labs    07/21/23 1800  CHOL 213*  HDL 73  LDLCALC 119*  TRIG 104  CHOLHDL 2.9   Thyroid  Function Tests: No results for input(s): TSH, T4TOTAL, FREET4, T3FREE, THYROIDAB in the last 72 hours. Anemia Panel: No results for input(s): VITAMINB12, FOLATE, FERRITIN, TIBC, IRON, RETICCTPCT in the last 72 hours. Sepsis Labs: No results for input(s): PROCALCITON, LATICACIDVEN in the last 168 hours.  No results found for this or any previous visit (from the past 240 hours).       Radiology Studies: DG Chest 2 View Addendum Date: 07/21/2023 ADDENDUM REPORT: 07/21/2023 19:23 Electronically Signed   By: Lynwood Landy Raddle M.D.   On: 07/21/2023 19:23   Result Date: 07/21/2023 CLINICAL DATA:  Left arm pain, abnormal EKG. EXAM: CHEST - 2 VIEW COMPARISON:  September 03, 2012. FINDINGS: The heart size and mediastinal contours are within normal limits. Both lungs are clear. The visualized skeletal structures are unremarkable. IMPRESSION: No active cardiopulmonary disease. Electronically Signed: By: Lynwood Landy Raddle M.D. On: 07/21/2023 18:57        Scheduled Meds:  amLODipine  10 mg Oral Daily   aspirin EC  81 mg Oral Daily   atorvastatin  80 mg Oral Daily   irbesartan  300 mg Oral Daily   metoprolol tartrate  25 mg Oral BID   Continuous Infusions:  heparin 800 Units/hr (07/21/23 2109)     LOS: 0 days    Time spent: 35 Minutes    Blayklee Mable A Kire Ferg, MD [facility]   If 7PM-7AM, please contact night-coverage [email or web address]  07/22/2023, 7:13 AM

## 2023-07-22 NOTE — ED Notes (Signed)
 Called ccmd to place pt on monitor

## 2023-07-22 NOTE — ED Notes (Signed)
 Called charge of 2C to let them know patient coming upstairs

## 2023-07-22 NOTE — Progress Notes (Addendum)
 Rounding Note   Patient Name: Joanne Duncan Date of Encounter: 07/22/2023  [facility] Cardiologist: Dorn Lesches, MD   Subjective Reports she is doing okay this morning, ongoing left arm discomfort though improved since being placed on heparin.  Denies chest pain or pain to jaw. No SOB.   Stated that her arm discomfort ( throbbing) and back pain was her initial presentation.  No history of cardiac issues.  Scheduled Meds:  amLODipine  10 mg Oral Daily   aspirin EC  81 mg Oral Daily   atorvastatin  80 mg Oral Daily   metoprolol tartrate  25 mg Oral BID   Continuous Infusions:  heparin 900 Units/hr (07/22/23 0732)   PRN Meds: nitroGLYCERIN   Vital Signs  Vitals:   07/21/23 2226 07/22/23 0130 07/22/23 0302 07/22/23 0515  BP: 132/69 129/69  125/62  Pulse: 84 63  65  Resp:  13  18  Temp:   98.7 F (37.1 C)   TempSrc:   Oral   SpO2:  100%  100%  Weight:      Height:       No intake or output data in the 24 hours ending 07/22/23 0845    07/21/2023    5:23 PM 08/26/2022    7:53 AM 07/31/2021    8:15 AM  Last 3 Weights  Weight (lbs) 153 lb 153 lb 142 lb  Weight (kg) 69.4 kg 69.4 kg 64.411 kg      Telemetry Sinus with incomplete RBBB, occasional PVC and one missed beat HR 60-80 - Personally Reviewed  ECG  Sinus with new incomplete RBBB - Personally Reviewed  Physical Exam GEN: No acute distress walking in room.   Neck: No JVD Cardiac: RRR Respiratory: Breathing comfortably on room air. MS: No edema; No deformity. Neuro:  Nonfocal  Psych: Normal affect   Labs High Sensitivity Troponin:   Recent Labs  Lab 07/21/23 1732 07/21/23 1932  TROPONINIHS 191* 589*     Chemistry Recent Labs  Lab 07/21/23 1732 07/22/23 0521  NA 141 140  K 4.2 3.8  CL 106 106  CO2 25 23  GLUCOSE 121* 107*  BUN 15 9  CREATININE 0.90 0.65  CALCIUM 9.3 8.7*  PROT 7.3 6.5  ALBUMIN 3.8 3.4*  AST 25 30  ALT 28 26  ALKPHOS 51 49  BILITOT 0.3 0.9  GFRNONAA  >60 >60  ANIONGAP 10 11    Lipids  Recent Labs  Lab 07/21/23 1800  CHOL 213*  TRIG 104  HDL 73  LDLCALC 119*  CHOLHDL 2.9    Hematology Recent Labs  Lab 07/21/23 1732 07/22/23 0521  WBC 7.6 6.5  RBC 3.90 3.78*  HGB 11.7* 11.3*  HCT 36.5 34.7*  MCV 93.6 91.8  MCH 30.0 29.9  MCHC 32.1 32.6  RDW 13.2 13.2  PLT 245 226   Thyroid  No results for input(s): TSH, FREET4 in the last 168 hours.  BNPNo results for input(s): BNP, PROBNP in the last 168 hours.  DDimer No results for input(s): DDIMER in the last 168 hours.   Radiology  DG Chest 2 View Addendum Date: 07/21/2023 ADDENDUM REPORT: 07/21/2023 19:23 Electronically Signed   By: Lynwood Landy Raddle M.D.   On: 07/21/2023 19:23   Result Date: 07/21/2023 CLINICAL DATA:  Left arm pain, abnormal EKG. EXAM: CHEST - 2 VIEW COMPARISON:  September 03, 2012. FINDINGS: The heart size and mediastinal contours are within normal limits. Both lungs are clear. The visualized skeletal structures are unremarkable.  IMPRESSION: No active cardiopulmonary disease. Electronically Signed: By: Lynwood Landy Raddle M.D. On: 07/21/2023 18:57    Patient Profile   73 y.o. 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 She was recommended to present to the ED due to her ECG findings (new incomplete RBBB)  Assessment & Plan  NSTEMI Patient presented with atypical symptoms 07/21/23 including throbbing left arm and back pain. Denied chest pain. No prior history of cardiac issues. Denied prior anginal symptoms. Received ASA 325 and SL nitroglycerin on arrival to ED. Troponin (191->589)   ECG showed new incomplete RBBB CXR unremarkable On interview this morning, left arm pain is still present though improved.   Despite atypical symptoms, would like to pursue cardiac  catheterization today give positive troponins. Patient is prediabetic and has a history of hypertension.   Patient to remain NPO.  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 Chronic pain Will need to educate patient about avoiding NSAIDS pending catheterization results  Per primary Anemia     For questions or updates, please contact [facility] Please consult [email or web address] for contact info under     Signed, Leontine LOISE Salen, PA-C  07/22/2023, 8:45 AM    Agree with note by Leontine Salen, PA-C  Patient admitted with chest arm pain and non-STEMI.  Other problems include hyperlipidemia and hypertension.  No acute findings on her EKG although she does have a new right bundle branch block.  She is currently pain-free on IV heparin.  Her exam is benign.  She is scheduled for left heart cath later today.   I have reviewed the risks, indications, and alternatives to cardiac catheterization, possible angioplasty, and stenting with the patient. Risks include but are not limited to bleeding, infection, vascular injury, stroke, myocardial infection, arrhythmia, kidney injury, radiation-related injury in the case of prolonged fluoroscopy use, emergency cardiac surgery, and death. The patient understands the risks of serious complication is 1-2 in 1000 with diagnostic cardiac cath and 1-2% or less with angioplasty/stenting.    Dorn DOROTHA Lesches, M.D., FACP, [facility], FAHA, [facility]  [address] [place], [place]  [address]  [phone] 07/22/2023 10:41 AM

## 2023-07-22 NOTE — Brief Op Note (Signed)
 07/22/2023  5:04 PM  PATIENT:  Joanne Duncan  73 y.o. 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 Normal LVEF and LVEDP.  RECOMMENDATIONS: Medical therapy and risk factor modification to prevent progression of disease. Further w/u of left arm pain and elevated Tn per primary team.  Lonni Hanson, MD [facility]

## 2023-07-22 NOTE — ED Notes (Signed)
 Pt ambulated to the bathroom.

## 2023-07-23 ENCOUNTER — Other Ambulatory Visit (HOSPITAL_COMMUNITY): Payer: Self-pay

## 2023-07-23 ENCOUNTER — Encounter (HOSPITAL_COMMUNITY): Payer: Self-pay | Admitting: Internal Medicine

## 2023-07-23 ENCOUNTER — Telehealth (HOSPITAL_COMMUNITY): Payer: Self-pay | Admitting: *Deleted

## 2023-07-23 ENCOUNTER — Observation Stay (HOSPITAL_COMMUNITY)

## 2023-07-23 DIAGNOSIS — R7989 Other specified abnormal findings of blood chemistry: Secondary | ICD-10-CM | POA: Diagnosis not present

## 2023-07-23 DIAGNOSIS — I4510 Unspecified right bundle-branch block: Secondary | ICD-10-CM | POA: Diagnosis not present

## 2023-07-23 DIAGNOSIS — I214 Non-ST elevation (NSTEMI) myocardial infarction: Secondary | ICD-10-CM | POA: Diagnosis not present

## 2023-07-23 DIAGNOSIS — I10 Essential (primary) hypertension: Secondary | ICD-10-CM | POA: Diagnosis not present

## 2023-07-23 LAB — BASIC METABOLIC PANEL WITH GFR
Anion gap: 10 (ref 5–15)
BUN: 12 mg/dL (ref 8–23)
CO2: 21 mmol/L — ABNORMAL LOW (ref 22–32)
Calcium: 8.5 mg/dL — ABNORMAL LOW (ref 8.9–10.3)
Chloride: 107 mmol/L (ref 98–111)
Creatinine, Ser: 0.81 mg/dL (ref 0.44–1.00)
GFR, Estimated: >60 mL/min (ref >60)
Glucose, Bld: 87 mg/dL (ref 70–99)
Potassium: 4.4 mmol/L (ref 3.5–5.1)
Sodium: 138 mmol/L (ref 135–145)

## 2023-07-23 MED ORDER — AMLODIPINE-OLMESARTAN 10-40 MG PO TABS: 1.0000 | ORAL_TABLET | Freq: Every morning | ORAL | 0 refills | Status: DC

## 2023-07-23 MED ORDER — METOPROLOL TARTRATE 25 MG PO TABS: 25.0000 mg | ORAL_TABLET | Freq: Two times a day (BID) | ORAL | 1 refills | Status: DC

## 2023-07-23 MED ORDER — ASPIRIN 81 MG PO TBEC
81.0000 mg | DELAYED_RELEASE_TABLET | Freq: Every day | ORAL | 12 refills | Status: AC
Start: 2023-07-23 — End: ?

## 2023-07-23 MED ORDER — ATORVASTATIN CALCIUM 80 MG PO TABS
80.0000 mg | ORAL_TABLET | Freq: Every day | ORAL | 2 refills | Status: DC
Start: 2023-07-23 — End: 2023-10-18

## 2023-07-23 NOTE — Care Management Obs Status (Signed)
 MEDICARE OBSERVATION STATUS NOTIFICATION   Patient Details  Name: Joanne Duncan MRN: [id number] Date of Birth: 03-07-1950   Medicare Observation Status Notification Given:  Yes  Letter sign copy Given  Claretta Deed 07/23/2023, 10:22 AM

## 2023-07-23 NOTE — Telephone Encounter (Signed)
 Called pt to discuss MINOCA and CRPII. Education given. She is interested in CRPII and will have [place] program to call to discuss further.  Aliene Aris BS, ACSM-CEP 07/23/2023 2:52 PM

## 2023-07-23 NOTE — Progress Notes (Signed)
 Rounding Note   Patient Name: Joanne Duncan Date of Encounter: 07/23/2023  [facility] Cardiologist: Dorn Lesches, MD   Subjective Status post left heart cath yesterday.  No further left upper extremity discomfort.  Scheduled Meds:  amLODipine   10 mg Oral Daily   aspirin  EC  81 mg Oral Daily   atorvastatin   80 mg Oral Daily   enoxaparin  (LOVENOX ) injection  40 mg Subcutaneous Q24H   metoprolol  tartrate  25 mg Oral BID   sodium chloride  flush  3 mL Intravenous Q12H   Continuous Infusions:  sodium chloride      PRN Meds: sodium chloride , acetaminophen , nitroGLYCERIN , ondansetron  (ZOFRAN ) IV, sodium chloride  flush, traZODone    Vital Signs  Vitals:   07/22/23 2208 07/22/23 2328 07/23/23 0404 07/23/23 0713  BP: 107/60 (!) 103/58 110/76 128/62  Pulse: 79 65 70 73  Resp:  16 19 20   Temp:  98 F (36.7 C) 97.6 F (36.4 C) 97.7 F (36.5 C)  TempSrc:  Oral Oral Oral  SpO2:  96% 100% 99%  Weight:      Height:       No intake or output data in the 24 hours ending 07/23/23 0804    07/22/2023    2:00 PM 07/21/2023    5:23 PM 08/26/2022    7:53 AM  Last 3 Weights  Weight (lbs) 152 lb 5.4 oz 153 lb 153 lb  Weight (kg) 69.1 kg 69.4 kg 69.4 kg      Telemetry Sinus with incomplete RBBB, occasional PVC and one missed beat HR 60-80 - Personally Reviewed  ECG  Not performed today  Physical Exam GEN: No acute distress walking in room.   Neck: No JVD Cardiac: RRR Respiratory: Breathing comfortably on room air. MS: No edema; No deformity. Neuro:  Nonfocal  Psych: Normal affect   Labs High Sensitivity Troponin:   Recent Labs  Lab 07/21/23 1732 07/21/23 1932  TROPONINIHS 191* 589*     Chemistry Recent Labs  Lab 07/21/23 1732 07/22/23 0521 07/22/23 1441 07/23/23 0332  NA 141 140  --  138  K 4.2 3.8  --  4.4  CL 106 106  --  107  CO2 25 23  --  21*  GLUCOSE 121* 107*  --  87  BUN 15 9  --  12  CREATININE 0.90 0.65  --  0.81  CALCIUM  9.3 8.7*  --   8.5*  MG  --   --  2.1  --   PROT 7.3 6.5  --   --   ALBUMIN 3.8 3.4*  --   --   AST 25 30  --   --   ALT 28 26  --   --   ALKPHOS 51 49  --   --   BILITOT 0.3 0.9  --   --   GFRNONAA >60 >60  --  >60  ANIONGAP 10 11  --  10    Lipids  Recent Labs  Lab 07/21/23 1800  CHOL 213*  TRIG 104  HDL 73  LDLCALC 119*  CHOLHDL 2.9    Hematology Recent Labs  Lab 07/21/23 1732 07/22/23 0521  WBC 7.6 6.5  RBC 3.90 3.78*  HGB 11.7* 11.3*  HCT 36.5 34.7*  MCV 93.6 91.8  MCH 30.0 29.9  MCHC 32.1 32.6  RDW 13.2 13.2  PLT 245 226   Thyroid  No results for input(s): TSH, FREET4 in the last 168 hours.  BNPNo results for input(s): BNP, PROBNP in the last  168 hours.  DDimer No results for input(s): DDIMER in the last 168 hours.   Radiology  CARDIAC CATHETERIZATION Result Date: 07/22/2023 Conclusions: Mild, nonobstructive coronary artery disease with 20% stenosis in the proximal LAD.  Otherwise, no angiographically significant disease consistent with MI with nonobstructive coronary artery disease (MINOCA). Significant coronary artery tortuosity suggestive of longstanding hypertension. Normal left ventricular systolic function and filling pressure. Recommendations: Continue medical therapy and risk factor modification to prevent progression of coronary artery disease. Consider further workup for alternative etiologies of elevated troponin and left arm pain per primary team. Lonni Hanson, MD [facility]  ECHOCARDIOGRAM COMPLETE Result Date: 07/22/2023    ECHOCARDIOGRAM REPORT   Patient Name:   Joanne Duncan Date of Exam: 07/22/2023 Medical Rec #:  [id number]         Height:       64.0 in Accession #:    [id number]        Weight:       153.0 lb Date of Birth:  1950/04/19        BSA:          1.746 m Patient Age:    73 years          BP:           125/62 mmHg Patient Gender: F                 HR:           83 bpm. Exam Location:  Inpatient Procedure: 2D Echo, Cardiac Doppler and  Color Doppler (Both Spectral and Color            Flow Doppler were utilized during procedure). Indications:    Acute ischemic heart disease  History:        Patient has no prior history of Echocardiogram examinations.                 NON-STEMI.  Sonographer:    Benard Stallion Referring Phys: [id number] ROBIN FERNANDES IMPRESSIONS  1. Left ventricular ejection fraction, by estimation, is 65 to 70%. The left ventricle has normal function. The left ventricle has no regional wall motion abnormalities. There is mild concentric left ventricular hypertrophy. Left ventricular diastolic parameters are indeterminate.  2. Right ventricular systolic function is normal. The right ventricular size is normal. There is normal pulmonary artery systolic pressure.  3. Left atrial size was mild to moderately dilated.  4. There is no evidence of cardiac tamponade.  5. The mitral valve is normal in structure. No evidence of mitral valve regurgitation.  6. The aortic valve is tricuspid. Aortic valve regurgitation is not visualized.  7. The inferior vena cava is normal in size with greater than 50% respiratory variability, suggesting right atrial pressure of 3 mmHg. FINDINGS  Left Ventricle: Left ventricular ejection fraction, by estimation, is 65 to 70%. The left ventricle has normal function. The left ventricle has no regional wall motion abnormalities. The left ventricular internal cavity size was normal in size. There is  mild concentric left ventricular hypertrophy. Left ventricular diastolic parameters are indeterminate. Right Ventricle: The right ventricular size is normal. No increase in right ventricular wall thickness. Right ventricular systolic function is normal. There is normal pulmonary artery systolic pressure. The tricuspid regurgitant velocity is 2.35 m/s, and  with an assumed right atrial pressure of 3 mmHg, the estimated right ventricular systolic pressure is 25.1 mmHg. Left Atrium: Left atrial size was mild to  moderately dilated. Right Atrium: Right atrial size  was normal in size. Pericardium: Trivial pericardial effusion is present. There is no evidence of cardiac tamponade. Mitral Valve: The mitral valve is normal in structure. No evidence of mitral valve regurgitation. Tricuspid Valve: The tricuspid valve is normal in structure. Tricuspid valve regurgitation is mild. Aortic Valve: The aortic valve is tricuspid. Aortic valve regurgitation is not visualized. Aortic valve mean gradient measures 6.0 mmHg. Aortic valve peak gradient measures 11.9 mmHg. Aortic valve area, by VTI measures 2.93 cm. Pulmonic Valve: The pulmonic valve was normal in structure. Pulmonic valve regurgitation is not visualized. Aorta: The aortic root and ascending aorta are structurally normal, with no evidence of dilitation. Venous: The inferior vena cava is normal in size with greater than 50% respiratory variability, suggesting right atrial pressure of 3 mmHg. IAS/Shunts: No atrial level shunt detected by color flow Doppler.  LEFT VENTRICLE PLAX 2D LVIDd:         4.80 cm   Diastology LVIDs:         2.80 cm   LV e' medial:    6.99 cm/s LV PW:         1.00 cm   LV E/e' medial:  11.1 LV IVS:        1.00 cm   LV e' lateral:   5.44 cm/s LVOT diam:     2.10 cm   LV E/e' lateral: 14.3 LV SV:         90 LV SV Index:   52 LVOT Area:     3.46 cm  RIGHT VENTRICLE RV Basal diam:  2.80 cm RV Mid diam:    2.10 cm RV S prime:     16.90 cm/s TAPSE (M-mode): 2.6 cm LEFT ATRIUM             Index        RIGHT ATRIUM           Index LA diam:        4.20 cm 2.41 cm/m   RA Area:     10.80 cm LA Vol (A2C):   67.9 ml 38.89 ml/m  RA Volume:   20.20 ml  11.57 ml/m LA Vol (A4C):   68.4 ml 39.18 ml/m LA Biplane Vol: 69.4 ml 39.75 ml/m  AORTIC VALVE AV Area (Vmax):    2.77 cm AV Area (Vmean):   2.78 cm AV Area (VTI):     2.93 cm AV Vmax:           172.67 cm/s AV Vmean:          114.000 cm/s AV VTI:            0.307 m AV Peak Grad:      11.9 mmHg AV Mean Grad:       6.0 mmHg LVOT Vmax:         138.00 cm/s LVOT Vmean:        91.500 cm/s LVOT VTI:          0.260 m LVOT/AV VTI ratio: 0.85  AORTA Ao Root diam: 3.00 cm Ao Asc diam:  3.10 cm MITRAL VALVE               TRICUSPID VALVE MV Area (PHT): 4.49 cm    TR Peak grad:   22.1 mmHg MV Decel Time: 169 msec    TR Vmax:        235.00 cm/s MV E velocity: 77.90 cm/s MV A velocity: 96.80 cm/s  SHUNTS MV E/A ratio:  0.80  Systemic VTI:  0.26 m                            Systemic Diam: 2.10 cm Morene Brownie Electronically signed by Morene Brownie Signature Date/Time: 07/22/2023/12:18:59 PM    Final    DG Chest 2 View Addendum Date: 07/21/2023 ADDENDUM REPORT: 07/21/2023 19:23 Electronically Signed   By: Lynwood Landy Raddle M.D.   On: 07/21/2023 19:23   Result Date: 07/21/2023 CLINICAL DATA:  Left arm pain, abnormal EKG. EXAM: CHEST - 2 VIEW COMPARISON:  September 03, 2012. FINDINGS: The heart size and mediastinal contours are within normal limits. Both lungs are clear. The visualized skeletal structures are unremarkable. IMPRESSION: No active cardiopulmonary disease. Electronically Signed: By: Lynwood Landy Raddle M.D. On: 07/21/2023 18:57   Imaging studies-  Left heart cath (07/22/2023)  Conclusions: Mild, nonobstructive coronary artery disease with 20% stenosis in the proximal LAD.  Otherwise, no angiographically significant disease consistent with MI with nonobstructive coronary artery disease (MINOCA). Significant coronary artery tortuosity suggestive of longstanding hypertension. Normal left ventricular systolic function and filling pressure.   Recommendations: Continue medical therapy and risk factor modification to prevent progression of coronary artery disease. Consider further workup for alternative etiologies of elevated troponin and left arm pain per primary team.   Lonni Hanson, MD [facility]  2D echocardiogram (07/22/2023)  IMPRESSIONS     1. Left ventricular ejection fraction, by estimation, is  65 to 70%. The  left ventricle has normal function. The left ventricle has no regional  wall motion abnormalities. There is mild concentric left ventricular  hypertrophy. Left ventricular diastolic  parameters are indeterminate.   2. Right ventricular systolic function is normal. The right ventricular  size is normal. There is normal pulmonary artery systolic pressure.   3. Left atrial size was mild to moderately dilated.   4. There is no evidence of cardiac tamponade.   5. The mitral valve is normal in structure. No evidence of mitral valve  regurgitation.   6. The aortic valve is tricuspid. Aortic valve regurgitation is not  visualized.   7. The inferior vena cava is normal in size with greater than 50%  respiratory variability, suggesting right atrial pressure of 3 mmHg   Patient Profile   73 y.o. 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 She was recommended to present to the ED due to her ECG findings (new incomplete RBBB)  Assessment & Plan  NSTEMI Patient presented with atypical symptoms 07/21/23 including throbbing left arm and back pain. Denied chest pain. No prior history of cardiac issues. Denied prior anginal symptoms. Received ASA 325 and SL nitroglycerin  on arrival to ED.Troponin (191->589)  .  Left heart cath performed by Dr. Hanson yesterday showed no obstructive disease or culprit lesions.  Patient has been asymptomatic since.  Etiology for her arm pain and elevated enzymes is still unclear.  Coronary vasospasm remains possible etiology.  Her 2D echo was entirely normal as well.    Hypertension BP: 120/62 -continue amlodipine  10 mg -continue metoprolol  tartrate 25 mg BID -continue to hold home hydrochlorothiazide  History of fibromuscular dysplasia of the renal artery status post  angioplasty Current Cr 0.65 -will monitor renal function during admission  4. Chronic pain Will need to educate patient about avoiding NSAIDS pending catheterization  results  Per primary Anemia   Clean cath yesterday.  No etiology determined for her arm pain and positive enzymes.  Partially potential coronary vasospasm.  Okay for discharge from our point of view.  Follow-up with an APP in 7 to 10 days and then.  After that.   For questions or updates, please contact [facility] Please consult [email or web address] for contact info under     Dorn DOROTHA Lesches, M.D., [facility], [place], [facility]  [address] [place], [place]  [address]  [phone] 07/23/2023 8:08 AM

## 2023-07-23 NOTE — Discharge Summary (Signed)
 Physician Discharge Summary   Patient: Joanne Duncan MRN: [id number] DOB: 05/04/1950  Admit date:     07/21/2023  Discharge date: 07/23/23  Discharge Physician: Owen DELENA Lore   PCP: Corlis Pagan, NP   Recommendations at discharge:    Follow up with cardiology in 7 days.  Further risk factors modifications.   Discharge Diagnoses: Principal Problem:   Non-STEMI (non-ST elevated myocardial infarction) (HCC) Active Problems:   HYPERTENSION, BENIGN SYSTEMIC   Iron deficiency anemia  Resolved Problems:   * No resolved hospital problems. *  Hospital Course: 73 year old, PMH significant for fibromuscular dysplasia of the renal artery s/p angioplasty presents complaining of left arm pain.  He denies associated chest pain and shortness of breath.  He presented for evaluation of urgent care.  She had an abnormal EKG.  Evaluation in the ED EKG showed right bundle branch block, initial troponin 191 repeated 489.  Patient was given nitroglycerin  and aspirin  and started on heparin  drip.  Cardiology has been consulted.    Assessment and Plan: 1-Non-STEMI: Patient presented with left arm pain, abnormal EKG with right bundle branch block, elevation of troponin - Cardiology consulted, Cath: no Obstructive diseases or culprit lesions.  - Started on low-dose metoprolol , high-dose Lipitor .  Continue aspirin  - Patient was started on heparin  drip No further arm discomfort. Chest x ray negative Presentation thought to be related to coronary Vasospasm.  ECHO was normal. No right heart side dysfunction.    Hypertension: - Continue amlodipine  and beta-blocker. -Resume home meds at discharge.   Anemia: Continue iron supplement   HLD: LDL 119.  Started on high intensity statins.    Pre-DM;  Carb modified diet.         Consultants: Cardiology  Procedures performed: Cath  Disposition: Home Diet recommendation:  Discharge Diet Orders (From admission, onward)     Start     Ordered    07/23/23 0000  Diet - low sodium heart healthy        07/23/23 0842           Cardiac diet DISCHARGE MEDICATION: Allergies as of 07/23/2023   No Known Allergies      Medication List     STOP taking these medications    metoprolol -hydrochlorothiazide 50-25 MG tablet Commonly known as: LOPRESSOR  HCT   naproxen sodium 220 MG tablet Commonly known as: ALEVE       TAKE these medications    amLODipine -olmesartan  10-40 MG tablet Commonly known as: AZOR  Take 1 tablet by mouth every morning for 10 days. Start taking on: July 24, 2023   aspirin  EC 81 MG tablet Take 1 tablet (81 mg total) by mouth daily. Swallow whole.   atorvastatin  80 MG tablet Commonly known as: LIPITOR  Take 1 tablet (80 mg total) by mouth daily.   HYDROcodone -acetaminophen  5-325 MG tablet Commonly known as: NORCO/VICODIN Take 2 tablets by mouth every 4 (four) hours as needed.   Iron 325 (65 Fe) MG Tabs Take by mouth.   metoprolol  tartrate 25 MG tablet Commonly known as: LOPRESSOR  Take 1 tablet (25 mg total) by mouth 2 (two) times daily.   multivitamin tablet Take 1 tablet by mouth daily.   Systane Complete 0.6 % Soln Generic drug: Propylene Glycol Place 1 drop into both eyes as needed (dry eyes).   tiZANidine 2 MG tablet Commonly known as: ZANAFLEX Take 4 mg by mouth at bedtime as needed for muscle spasms.   Vitamin D3 125 MCG (5000 UT) Tabs Take 1 tablet by mouth  daily.        Discharge Exam: Filed Weights   07/21/23 1723 07/22/23 1400  Weight: 69.4 kg 69.1 kg   General; NAD  Condition at discharge: stable  The results of significant diagnostics from this hospitalization (including imaging, microbiology, ancillary and laboratory) are listed below for reference.   Imaging Studies: CARDIAC CATHETERIZATION Result Date: 07/22/2023 Conclusions: Mild, nonobstructive coronary artery disease with 20% stenosis in the proximal LAD.  Otherwise, no angiographically significant  disease consistent with MI with nonobstructive coronary artery disease (MINOCA). Significant coronary artery tortuosity suggestive of longstanding hypertension. Normal left ventricular systolic function and filling pressure. Recommendations: Continue medical therapy and risk factor modification to prevent progression of coronary artery disease. Consider further workup for alternative etiologies of elevated troponin and left arm pain per primary team. Lonni Hanson, MD [facility]  ECHOCARDIOGRAM COMPLETE Result Date: 07/22/2023    ECHOCARDIOGRAM REPORT   Patient Name:   Joanne Duncan Date of Exam: 07/22/2023 Medical Rec #:  [id number]         Height:       64.0 in Accession #:    [id number]        Weight:       153.0 lb Date of Birth:  [birth year]        BSA:          1.746 m Patient Age:    72 years          BP:           125/62 mmHg Patient Gender: F                 HR:           83 bpm. Exam Location:  Inpatient Procedure: 2D Echo, Cardiac Doppler and Color Doppler (Both Spectral and Color            Flow Doppler were utilized during procedure). Indications:    Acute ischemic heart disease  History:        Patient has no prior history of Echocardiogram examinations.                 NON-STEMI.  Sonographer:    Benard Stallion Referring Phys: [id number] ROBIN FERNANDES IMPRESSIONS  1. Left ventricular ejection fraction, by estimation, is 65 to 70%. The left ventricle has normal function. The left ventricle has no regional wall motion abnormalities. There is mild concentric left ventricular hypertrophy. Left ventricular diastolic parameters are indeterminate.  2. Right ventricular systolic function is normal. The right ventricular size is normal. There is normal pulmonary artery systolic pressure.  3. Left atrial size was mild to moderately dilated.  4. There is no evidence of cardiac tamponade.  5. The mitral valve is normal in structure. No evidence of mitral valve regurgitation.  6. The aortic valve is  tricuspid. Aortic valve regurgitation is not visualized.  7. The inferior vena cava is normal in size with greater than 50% respiratory variability, suggesting right atrial pressure of 3 mmHg. FINDINGS  Left Ventricle: Left ventricular ejection fraction, by estimation, is 65 to 70%. The left ventricle has normal function. The left ventricle has no regional wall motion abnormalities. The left ventricular internal cavity size was normal in size. There is  mild concentric left ventricular hypertrophy. Left ventricular diastolic parameters are indeterminate. Right Ventricle: The right ventricular size is normal. No increase in right ventricular wall thickness. Right ventricular systolic function is normal. There is normal pulmonary artery systolic pressure. The tricuspid regurgitant  velocity is 2.35 m/s, and  with an assumed right atrial pressure of 3 mmHg, the estimated right ventricular systolic pressure is 25.1 mmHg. Left Atrium: Left atrial size was mild to moderately dilated. Right Atrium: Right atrial size was normal in size. Pericardium: Trivial pericardial effusion is present. There is no evidence of cardiac tamponade. Mitral Valve: The mitral valve is normal in structure. No evidence of mitral valve regurgitation. Tricuspid Valve: The tricuspid valve is normal in structure. Tricuspid valve regurgitation is mild. Aortic Valve: The aortic valve is tricuspid. Aortic valve regurgitation is not visualized. Aortic valve mean gradient measures 6.0 mmHg. Aortic valve peak gradient measures 11.9 mmHg. Aortic valve area, by VTI measures 2.93 cm. Pulmonic Valve: The pulmonic valve was normal in structure. Pulmonic valve regurgitation is not visualized. Aorta: The aortic root and ascending aorta are structurally normal, with no evidence of dilitation. Venous: The inferior vena cava is normal in size with greater than 50% respiratory variability, suggesting right atrial pressure of 3 mmHg. IAS/Shunts: No atrial level shunt  detected by color flow Doppler.  LEFT VENTRICLE PLAX 2D LVIDd:         4.80 cm   Diastology LVIDs:         2.80 cm   LV e' medial:    6.99 cm/s LV PW:         1.00 cm   LV E/e' medial:  11.1 LV IVS:        1.00 cm   LV e' lateral:   5.44 cm/s LVOT diam:     2.10 cm   LV E/e' lateral: 14.3 LV SV:         90 LV SV Index:   52 LVOT Area:     3.46 cm  RIGHT VENTRICLE RV Basal diam:  2.80 cm RV Mid diam:    2.10 cm RV S prime:     16.90 cm/s TAPSE (M-mode): 2.6 cm LEFT ATRIUM             Index        RIGHT ATRIUM           Index LA diam:        4.20 cm 2.41 cm/m   RA Area:     10.80 cm LA Vol (A2C):   67.9 ml 38.89 ml/m  RA Volume:   20.20 ml  11.57 ml/m LA Vol (A4C):   68.4 ml 39.18 ml/m LA Biplane Vol: 69.4 ml 39.75 ml/m  AORTIC VALVE AV Area (Vmax):    2.77 cm AV Area (Vmean):   2.78 cm AV Area (VTI):     2.93 cm AV Vmax:           172.67 cm/s AV Vmean:          114.000 cm/s AV VTI:            0.307 m AV Peak Grad:      11.9 mmHg AV Mean Grad:      6.0 mmHg LVOT Vmax:         138.00 cm/s LVOT Vmean:        91.500 cm/s LVOT VTI:          0.260 m LVOT/AV VTI ratio: 0.85  AORTA Ao Root diam: 3.00 cm Ao Asc diam:  3.10 cm MITRAL VALVE               TRICUSPID VALVE MV Area (PHT): 4.49 cm    TR Peak grad:   22.1 mmHg MV  Decel Time: 169 msec    TR Vmax:        235.00 cm/s MV E velocity: 77.90 cm/s MV A velocity: 96.80 cm/s  SHUNTS MV E/A ratio:  0.80        Systemic VTI:  0.26 m                            Systemic Diam: 2.10 cm Morene Brownie Electronically signed by Morene Brownie Signature Date/Time: 07/22/2023/12:18:59 PM    Final    DG Chest 2 View Addendum Date: 07/21/2023 ADDENDUM REPORT: 07/21/2023 19:23 Electronically Signed   By: Lynwood Landy Raddle M.D.   On: 07/21/2023 19:23   Result Date: 07/21/2023 CLINICAL DATA:  Left arm pain, abnormal EKG. EXAM: CHEST - 2 VIEW COMPARISON:  September 03, 2012. FINDINGS: The heart size and mediastinal contours are within normal limits. Both lungs are clear. The  visualized skeletal structures are unremarkable. IMPRESSION: No active cardiopulmonary disease. Electronically Signed: By: Lynwood Landy Raddle M.D. On: 07/21/2023 18:57    Microbiology: Results for orders placed or performed during the hospital encounter of 07/21/23  MRSA Next Gen by PCR, Nasal     Status: None   Collection Time: 07/22/23  2:35 PM   Specimen: Nasal Mucosa; Nasal Swab  Result Value Ref Range Status   MRSA by PCR Next Gen NOT DETECTED NOT DETECTED Final    Comment: (NOTE) The GeneXpert MRSA Assay (FDA approved for NASAL specimens only), is one component of a comprehensive MRSA colonization surveillance program. It is not intended to diagnose MRSA infection nor to guide or monitor treatment for MRSA infections. Test performance is not FDA approved in patients less than 52 years old. Performed at [facility], [address] [place], [place] [address]     Labs: CBC: Recent Labs  Lab 07/21/23 1732 07/22/23 0521  WBC 7.6 6.5  NEUTROABS 6.0  --   HGB 11.7* 11.3*  HCT 36.5 34.7*  MCV 93.6 91.8  PLT 245 226   Basic Metabolic Panel: Recent Labs  Lab 07/21/23 1732 07/22/23 0521 07/22/23 1441 07/23/23 0332  NA 141 140  --  138  K 4.2 3.8  --  4.4  CL 106 106  --  107  CO2 25 23  --  21*  GLUCOSE 121* 107*  --  87  BUN 15 9  --  12  CREATININE 0.90 0.65  --  0.81  CALCIUM  9.3 8.7*  --  8.5*  MG  --   --  2.1  --    Liver Function Tests: Recent Labs  Lab 07/21/23 1732 07/22/23 0521  AST 25 30  ALT 28 26  ALKPHOS 51 49  BILITOT 0.3 0.9  PROT 7.3 6.5  ALBUMIN 3.8 3.4*   CBG: No results for input(s): GLUCAP in the last 168 hours.  Discharge time spent: greater than 30 minutes.  Signed: Owen DELENA Lore, MD [facility] 07/23/2023

## 2023-07-23 NOTE — Plan of Care (Signed)
  Problem: Education: Goal: Knowledge of General Education information will improve Description: Including pain rating scale, medication(s)/side effects and non-pharmacologic comfort measures Outcome: Progressing   Problem: Clinical Measurements: Goal: Diagnostic test results will improve Outcome: Progressing Goal: Respiratory complications will improve Outcome: Progressing Goal: Cardiovascular complication will be avoided Outcome: Progressing   Problem: Activity: Goal: Risk for activity intolerance will decrease Outcome: Progressing   Problem: Nutrition: Goal: Adequate nutrition will be maintained Outcome: Progressing   Problem: Elimination: Goal: Will not experience complications related to bowel motility Outcome: Progressing Goal: Will not experience complications related to urinary retention Outcome: Progressing   Problem: Pain Managment: Goal: General experience of comfort will improve and/or be controlled Outcome: Progressing   Problem: Safety: Goal: Ability to remain free from injury will improve Outcome: Progressing   Problem: Activity: Goal: Ability to return to baseline activity level will improve Outcome: Progressing   Problem: Cardiovascular: Goal: Vascular access site(s) Level 0-1 will be maintained Outcome: Progressing

## 2023-07-23 NOTE — Progress Notes (Signed)
 Patient provided with verbal discharge instructions. Paper copy of discharge provided to patient. RN answered all questions. VSS at discharge. IV removed. Patient belongings sent with patient. Patient dc'd via wheelchair to d/c lounge.

## 2023-07-24 LAB — LIPOPROTEIN A (LPA): Lipoprotein (a): 50.8 nmol/L — ABNORMAL HIGH (ref <75.0)

## 2023-08-05 ENCOUNTER — Telehealth (HOSPITAL_COMMUNITY): Payer: Self-pay

## 2023-08-05 NOTE — Telephone Encounter (Signed)
 Called patient to see if she is interested in the Cardiac Rehab Program. Patient expressed interest. Explained scheduling process and went over insurance, patient verbalized understanding. Will contact patient for scheduling once f/u has been completed on 7/16.

## 2023-08-11 ENCOUNTER — Encounter: Payer: Self-pay | Admitting: Cardiovascular Disease

## 2023-08-11 ENCOUNTER — Ambulatory Visit: Attending: Cardiovascular Disease | Admitting: Cardiovascular Disease

## 2023-08-11 VITALS — BP 162/84 | HR 67 | Ht 64.0 in | Wt 150.0 lb

## 2023-08-11 DIAGNOSIS — I10 Essential (primary) hypertension: Secondary | ICD-10-CM | POA: Diagnosis not present

## 2023-08-11 DIAGNOSIS — I214 Non-ST elevation (NSTEMI) myocardial infarction: Secondary | ICD-10-CM

## 2023-08-11 DIAGNOSIS — I701 Atherosclerosis of renal artery: Secondary | ICD-10-CM | POA: Diagnosis not present

## 2023-08-11 DIAGNOSIS — E7800 Pure hypercholesterolemia, unspecified: Secondary | ICD-10-CM

## 2023-08-11 MED ORDER — AMLODIPINE-OLMESARTAN 10-40 MG PO TABS: 1.0000 | ORAL_TABLET | Freq: Every morning | ORAL | 3 refills | Status: AC

## 2023-08-11 NOTE — Assessment & Plan Note (Signed)
 Patient was admitted with non-STEMI on 07/21/2023.  Her troponins went up to 589.  EKG showed no acute changes.  Her initial symptoms were notable for left arm pain and scapular pain but no chest pain.  She underwent cardiac catheterization by Dr. Mady revealing minimal nonobstructive CAD.  The thought was that she may have had coronary vasospasm.  She was discharged home on amlodipine /olmesartan .  She is been pain-free since.

## 2023-08-11 NOTE — Patient Instructions (Addendum)
 Medication Instructions:  Your physician has recommended you make the following change in your medication:   -Restart amlodipine /olmesartan  (azor ) 10mg /40mg  once daily.  *If you need a refill on your cardiac medications before your next appointment, please call your pharmacy*  Lab Work: Your physician recommends that you return for lab work in: 3 months for FASTING lipid/liver panel   If you have labs (blood work) drawn today and your tests are completely normal, you will receive your results only by: MyChart Message (if you have MyChart) OR A paper copy in the mail If you have any lab test that is abnormal or we need to change your treatment, we will call you to review the results.   Follow-Up: At [facility], you and your health needs are our priority.  As part of our continuing mission to provide you with exceptional heart care, our providers are all part of one team.  This team includes your primary Cardiologist (physician) and Advanced Practice Providers or APPs (Physician Assistants and Nurse Practitioners) who all work together to provide you with the care you need, when you need it.  Your next appointment:   3 month(s)  Provider:   Jon Hails, PA-C, Callie Goodrich, PA-C, Kathleen Johnson, PA-C, Damien Braver, NP, or Katlyn West, NP         Then, Dorn Lesches, MD will plan to see you again in 12 month(s).     We recommend signing up for the patient portal called MyChart.  Sign up information is provided on this After Visit Summary.  MyChart is used to connect with patients for Virtual Visits (Telemedicine).  Patients are able to view lab/test results, encounter notes, upcoming appointments, etc.  Non-urgent messages can be sent to your provider as well.   To learn more about what you can do with MyChart, go to [email or web address].

## 2023-08-11 NOTE — Assessment & Plan Note (Signed)
 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

## 2023-08-11 NOTE — Assessment & Plan Note (Signed)
 History of essential hypertension with blood pressure measured today at 162/84.  She was on Azor  and metoprolol /hydrochlorothiazide as an outpatient.  Her blood pressure today is elevated probably because she was told only continue the Azor  for 10 days.  I am putting her back on amlodipine /olmesartan .

## 2023-08-11 NOTE — Progress Notes (Signed)
 08/11/2023 Joanne Duncan   1950-04-01  [id number]  Primary Physician Corlis Pagan, NP Primary Cardiologist: Dorn [name] MD [name], [name]  HPI:  Joanne Duncan is a 73 y.o. divorced African-American female mother of 1 son, grandmother of 2 grandchildren who just retired from [employer] after working there for 30 years.  Risk factors include treated hypertension hyperlipidemia.  There is no family history of heart disease.  She apparently had renal artery FMD angioplasty in 2011.  She exercises frequently walking 2 times a week and also participating in Silver sneakers.  She does have right bundle branch block.  She presented 07/21/2023 with left arm pain and scapular pain.  Her troponins did go up to 589.  She had recorded catheterization by Dr. Mady revealing minimal nonobstructive CAD.  It was thought that she may have had coronary vasospasm.   Current Meds  Medication Sig   aspirin  EC 81 MG tablet Take 1 tablet (81 mg total) by mouth daily. Swallow whole.   atorvastatin  (LIPITOR ) 80 MG tablet Take 1 tablet (80 mg total) by mouth daily.   Cholecalciferol (VITAMIN D3) 125 MCG (5000 UT) TABS Take 1 tablet by mouth daily.   Ferrous Sulfate (IRON) 325 (65 Fe) MG TABS Take by mouth.   HYDROcodone -acetaminophen  (NORCO/VICODIN) 5-325 MG per tablet Take 2 tablets by mouth every 4 (four) hours as needed.   metoprolol  tartrate (LOPRESSOR ) 25 MG tablet Take 1 tablet (25 mg total) by mouth 2 (two) times daily.   Multiple Vitamin (MULTIVITAMIN) tablet Take 1 tablet by mouth daily.   Propylene Glycol (SYSTANE COMPLETE) 0.6 % SOLN Place 1 drop into both eyes as needed (dry eyes).   tiZANidine (ZANAFLEX) 2 MG tablet Take 4 mg by mouth at bedtime as needed for muscle spasms.     No Known Allergies  Social History   Socioeconomic History   Marital status: Divorced    Spouse name: Not on file   Number of children: 1   Years of education: Not on file   Highest education  level: Not on file  Occupational History   Occupation: ASSEMBLY    Employer: [employer]  Tobacco Use   Smoking status: Never    Passive exposure: Never   Smokeless tobacco: Never  Vaping Use   Vaping status: Never Used  Substance and Sexual Activity   Alcohol use: Yes    Comment: occasional wine   Drug use: No   Sexual activity: Not Currently    Partners: Male    Birth control/protection: Surgical    Comment: hysterectomy, menarche 72yo, sexual debut before age 39  Other Topics Concern   Not on file  Social History Narrative   Works at [employer].  Divorced and has 1 son.   Social Drivers of Corporate investment banker Strain: Not on file  Food Insecurity: No Food Insecurity (07/22/2023)   Hunger Vital Sign    Worried About Running Out of Food in the Last Year: Never true    Ran Out of Food in the Last Year: Never true  Transportation Needs: No Transportation Needs (07/22/2023)   PRAPARE - Administrator, Civil Service (Medical): No    Lack of Transportation (Non-Medical): No  Physical Activity: Not on file  Stress: Not on file  Social Connections: Moderately Isolated (07/22/2023)   Social Connection and Isolation Panel    Frequency of Communication with Friends and Family: More than three times a week  Frequency of Social Gatherings with Friends and Family: Twice a week    Attends Religious Services: 1 to 4 times per year    Active Member of [employer] or Organizations: No    Attends Banker Meetings: Never    Marital Status: Divorced  Catering manager Violence: Not At Risk (07/22/2023)   Humiliation, Afraid, Rape, and Kick questionnaire    Fear of Current or Ex-Partner: No    Emotionally Abused: No    Physically Abused: No    Sexually Abused: No     Review of Systems: General: negative for chills, fever, night sweats or weight changes.  Cardiovascular: negative for chest pain, dyspnea on exertion, edema, orthopnea, palpitations, paroxysmal  nocturnal dyspnea or shortness of breath Dermatological: negative for rash Respiratory: negative for cough or wheezing Urologic: negative for hematuria Abdominal: negative for nausea, vomiting, diarrhea, bright red blood per rectum, melena, or hematemesis Neurologic: negative for visual changes, syncope, or dizziness All other systems reviewed and are otherwise negative except as noted above.    Blood pressure (!) 162/84, pulse 67, height 5' 4 (1.626 m), weight 150 lb (68 kg), last menstrual period 07/27/1994, SpO2 97%.  General appearance: alert and no distress Neck: no adenopathy, no carotid bruit, no JVD, supple, symmetrical, trachea midline, and thyroid  not enlarged, symmetric, no tenderness/mass/nodules Lungs: clear to auscultation bilaterally Heart: regular rate and rhythm, S1, S2 normal, no murmur, click, rub or gallop Extremities: extremities normal, atraumatic, no cyanosis or edema Pulses: 2+ and symmetric Skin: Skin color, texture, turgor normal. No rashes or lesions Neurologic: Grossly normal  EKG not performed today      ASSESSMENT AND PLAN:   HYPERTENSION, BENIGN SYSTEMIC History of essential hypertension with blood pressure measured today at 162/84.  She was on Azor  and metoprolol /hydrochlorothiazide as an outpatient.  Her blood pressure today is elevated probably because she was told only continue the Azor  for 10 days.  I am putting her back on amlodipine /olmesartan .  Hypercholesterolemia History of hyperlipidemia on high-dose atorvastatin  started during her hospitalization with lipid profile performed on admission 07/21/2023 revealing total cholesterol 213, LDL of 119 and HDL of 73.  We will recheck a lipid profile in 2 months.  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  Non-STEMI (non-ST elevated myocardial infarction) [facility]) Patient was admitted with non-STEMI on 07/21/2023.  Her troponins went up  to 589.  EKG showed no acute changes.  Her initial symptoms were notable for left arm pain and scapular pain but no chest pain.  She underwent cardiac catheterization by Dr. Mady revealing minimal nonobstructive CAD.  The thought was that she may have had coronary vasospasm.  She was discharged home on amlodipine /olmesartan .  She is been pain-free since.     Dorn [name] FACP,FACC,FAHA, [facility] 08/11/2023 10:55 AM

## 2023-08-11 NOTE — Assessment & Plan Note (Signed)
 History of hyperlipidemia on high-dose atorvastatin  started during her hospitalization with lipid profile performed on admission 07/21/2023 revealing total cholesterol 213, LDL of 119 and HDL of 73.  We will recheck a lipid profile in 2 months.

## 2023-08-19 ENCOUNTER — Telehealth (HOSPITAL_COMMUNITY): Payer: Self-pay

## 2023-08-19 NOTE — Telephone Encounter (Signed)
 Pt insurance is active and benefits verified through [facility]. Co-pay $25, DED $0/$0 met, out of pocket $6,750/$955 met, co-insurance 0%. No pre-authorization required. Passport, 08/19/2023 @ 10:21am, REF# [id number].  How many CR sessions are covered? (36 visits for TCR, 72 visits for ICR)72 ICR Is this a lifetime maximum or an annual maximum? Annual Has the member used any of these services to date? No Is there a time limit (weeks/months) on start of program and/or program completion? No

## 2023-08-20 ENCOUNTER — Telehealth (HOSPITAL_COMMUNITY): Payer: Self-pay

## 2023-08-20 NOTE — Telephone Encounter (Signed)
 Called patient to schedule cardiac rehab. Patient states they are not interested at this time, informed her she can call us  to reopen the referral if she changes her mind.  Closing referral.

## 2023-09-02 DIAGNOSIS — Z1231 Encounter for screening mammogram for malignant neoplasm of breast: Secondary | ICD-10-CM | POA: Diagnosis not present

## 2023-09-02 LAB — HM MAMMOGRAPHY

## 2023-09-07 ENCOUNTER — Encounter: Payer: Self-pay | Admitting: Radiology

## 2023-09-21 ENCOUNTER — Ambulatory Visit (INDEPENDENT_AMBULATORY_CARE_PROVIDER_SITE_OTHER): Admitting: Radiology

## 2023-09-21 ENCOUNTER — Encounter: Payer: Self-pay | Admitting: Radiology

## 2023-09-21 VITALS — BP 124/82 | HR 79 | Ht 65.5 in | Wt 147.0 lb

## 2023-09-21 DIAGNOSIS — Z9189 Other specified personal risk factors, not elsewhere classified: Secondary | ICD-10-CM

## 2023-09-21 DIAGNOSIS — M8580 Other specified disorders of bone density and structure, unspecified site: Secondary | ICD-10-CM

## 2023-09-21 DIAGNOSIS — Z01419 Encounter for gynecological examination (general) (routine) without abnormal findings: Secondary | ICD-10-CM

## 2023-09-21 NOTE — Progress Notes (Signed)
   Joanne Duncan 1950/07/25 [id number]   History:  73 y.o. G1P1 presents for annual exam. No gyn concerns. NSTEMI 6/25, followed by cardiology, was put on a statin. Doing well.  Gynecologic History/Health Maintenance Last Pap: 2005. Results were: normal Last mammogram: 2025. Results were: normal Last colonoscopy: 2024. Results were: poylps [facility] Last Dexa: 08/2022. Results were: osteopenia   Past medical history, past surgical history, family history and social history were all reviewed and documented in the EPIC chart.  ROS:  A ROS was performed and pertinent positives and negatives are included.  Exam:  Vitals:   09/21/23 0835  BP: 124/82  Pulse: 79  SpO2: 98%  Weight: 147 lb (66.7 kg)  Height: 5' 5.5 (1.664 m)   Body mass index is 24.09 kg/m.  General appearance:  Normal Thyroid :  Symmetrical, normal in size, without palpable masses or nodularity. Respiratory  Auscultation:  Clear without wheezing or rhonchi Cardiovascular  Auscultation:  Regular rate, without rubs, murmurs or gallops  Edema/varicosities:  Not grossly evident Abdominal  Soft,nontender, without masses, guarding or rebound.  Liver/spleen:  No organomegaly noted  Hernia:  None appreciated  Skin  Inspection:  Grossly normal Breasts: Examined lying and sitting.   Right: Without masses, retractions, nipple discharge or axillary adenopathy.   Left: Without masses, retractions, nipple discharge or axillary adenopathy. Genitourinary   Inguinal/mons:  Normal without inguinal adenopathy  External genitalia:  Normal appearing vulva with no masses, tenderness, or lesions  BUS/Urethra/Skene's glands:  Normal  Vagina:  Normal appearing with normal color and discharge, no lesions. Atrophy mild  Cervix:  absent  Uterus:  absent  Adnexa/parametria:     Rt: Normal in size, without masses or tenderness.   Lt: Normal in size, without masses or tenderness.  Anus and perineum: Normal   Darice Hoit, CMA  present for exam  Assessment/Plan:   1. Encounter for breast and pelvic examination (Primary) HR, return 1 year  2. Osteopenia, unspecified location Continue vit D and exercise    Return in 1 year for annual or sooner prn.  GINETTE COZIER B WHNP-BC 8:59 AM 09/21/2023

## 2023-09-22 ENCOUNTER — Telehealth: Payer: Self-pay | Admitting: Cardiovascular Disease

## 2023-09-22 MED ORDER — METOPROLOL TARTRATE 25 MG PO TABS: 25.0000 mg | ORAL_TABLET | Freq: Two times a day (BID) | ORAL | 3 refills | Status: AC

## 2023-09-22 NOTE — Telephone Encounter (Signed)
*  STAT* If patient is at the pharmacy, call can be transferred to refill team.   1. Which medications need to be refilled? (please list name of each medication and dose if known)   metoprolol  tartrate (LOPRESSOR ) 25 MG tablet  Take 1 tablet (25 mg total) by mouth 2 (two) times daily.    2. Would you like to learn more about the convenience, safety, & potential cost savings by using the [facility]? No    3. Are you open to using the [facility] No   4. Which pharmacy/location (including street and city if local pharmacy) is medication to be sent to? [id number] - [place], [place] - [address]     5. Do they need a 30 day or 90 day supply? 90 Day Supply  Pt is currently out of medication

## 2023-09-22 NOTE — Telephone Encounter (Signed)
 Pt's medication was sent to pt's pharmacy as requested. Confirmation received.

## 2023-10-05 DIAGNOSIS — Z79891 Long term (current) use of opiate analgesic: Secondary | ICD-10-CM | POA: Diagnosis not present

## 2023-10-05 DIAGNOSIS — E7800 Pure hypercholesterolemia, unspecified: Secondary | ICD-10-CM | POA: Diagnosis not present

## 2023-10-05 DIAGNOSIS — E559 Vitamin D deficiency, unspecified: Secondary | ICD-10-CM | POA: Diagnosis not present

## 2023-10-05 DIAGNOSIS — R7303 Prediabetes: Secondary | ICD-10-CM | POA: Diagnosis not present

## 2023-10-12 DIAGNOSIS — G4700 Insomnia, unspecified: Secondary | ICD-10-CM | POA: Diagnosis not present

## 2023-10-12 DIAGNOSIS — E041 Nontoxic single thyroid nodule: Secondary | ICD-10-CM | POA: Diagnosis not present

## 2023-10-12 DIAGNOSIS — E559 Vitamin D deficiency, unspecified: Secondary | ICD-10-CM | POA: Diagnosis not present

## 2023-10-12 DIAGNOSIS — Z79891 Long term (current) use of opiate analgesic: Secondary | ICD-10-CM | POA: Diagnosis not present

## 2023-10-12 DIAGNOSIS — Z0000 Encounter for general adult medical examination without abnormal findings: Secondary | ICD-10-CM | POA: Diagnosis not present

## 2023-10-12 DIAGNOSIS — R7303 Prediabetes: Secondary | ICD-10-CM | POA: Diagnosis not present

## 2023-10-12 DIAGNOSIS — G8929 Other chronic pain: Secondary | ICD-10-CM | POA: Diagnosis not present

## 2023-10-12 DIAGNOSIS — I10 Essential (primary) hypertension: Secondary | ICD-10-CM | POA: Diagnosis not present

## 2023-10-12 DIAGNOSIS — E7800 Pure hypercholesterolemia, unspecified: Secondary | ICD-10-CM | POA: Diagnosis not present

## 2023-10-18 ENCOUNTER — Telehealth: Payer: Self-pay | Admitting: Cardiovascular Disease

## 2023-10-18 MED ORDER — ATORVASTATIN CALCIUM 80 MG PO TABS: 80.0000 mg | ORAL_TABLET | Freq: Every day | ORAL | 3 refills | Status: AC

## 2023-10-18 NOTE — Telephone Encounter (Signed)
*  STAT* If patient is at the pharmacy, call can be transferred to refill team.   1. Which medications need to be refilled? (please list name of each medication and dose if known)   atorvastatin  (LIPITOR ) 80 MG tablet     2. Would you like to learn more about the convenience, safety, & potential cost savings by using the [facility]?    3. Are you open to using the [facility] (Type [facility].  ).   4. Which pharmacy/location (including street and city if local pharmacy) is medication to be sent to?  [id number] - [place], [place] - [address]     5. Do they need a 30 day or 90 day supply? 30 day

## 2023-10-18 NOTE — Telephone Encounter (Signed)
 Pt's medication was sent to pt's pharmacy as requested. Confirmation received.

## 2023-11-02 ENCOUNTER — Encounter: Payer: Self-pay | Admitting: Physician Assistant

## 2023-11-02 DIAGNOSIS — I2510 Atherosclerotic heart disease of native coronary artery without angina pectoris: Secondary | ICD-10-CM | POA: Insufficient documentation

## 2023-11-02 DIAGNOSIS — I773 Arterial fibromuscular dysplasia: Secondary | ICD-10-CM | POA: Insufficient documentation

## 2023-11-02 NOTE — Assessment & Plan Note (Signed)
 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  She has been managed for vasospasm.***

## 2023-11-02 NOTE — Assessment & Plan Note (Signed)
 Remote history of renal artery angioplasty in 2011.***

## 2023-11-02 NOTE — Progress Notes (Unsigned)
 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 History of Present Illness Joanne Duncan is a 73 y.o. female returns for follow-up on hypertension. She was last seen by Dr. Lesches 08/11/23 for posthospitalization follow-up.  She had been admitted in June 2025 with a non-STEMI.  She had left arm and back pain.  Troponin increased from 191 to 589.  Cardiac cath demonstrated mild nonobstructive CAD.  She has been managed for vasospasm.  At last visit, her BP was elevated. She was off of amlodipine /olmesartan . This was restarted.    Joanne Duncan is a 73 year old female with hypertension who presents for a follow-up visit.  She was last seen in July for blood pressure monitoring. She is currently on amlodipine , olmesartan , and metoprolol , taken twice daily. She has not been regularly monitoring her blood pressure at home.  She feels generally well with no chest symptoms, shortness of breath, syncope, headaches, blurry vision, or leg swelling. She engages in daily walking and participates in a 'Sit with Sneakers' class weekly. She declined cardiac rehab, managing her activity independently.  She has a blood pressure machine at home but has not used it recently. During a recent visit to her gynecologist on August 26, her blood pressure was 124/82 mmHg. A similar reading was recorded by her  primary care provider. She reports feeling nervous during today's visit and notes having eaten a hamburger the previous day, which she believes may have affected her blood pressure.  Her LDL cholesterol was checked last month and was 36 mg/dL, within the target range. She has not eaten today in preparation for potential blood work, although it was confirmed that no blood work is needed today.    ROS-See HPI    Studies Reviewed:       07/21/2023: Total cholesterol 213, LDL 119, HDL 73, triglycerides 104, LP(a) 50.8 07/23/2023: K 4.4, creatinine 0.81  Labs from primary care dated 10/05/2023 personally reviewed and interpreted by me today 11/03/2023: Hemoglobin 12.3, creatinine 0.76, potassium 3.7, ALT 22, A1c 5.9, total cholesterol 118, triglycerides 50, HDL 70, LDL 36  HYPERTENSION CONTROL Vitals:   11/03/23 0848 11/03/23 0952  BP: (!) 142/60 (!) 160/80    The patient's blood pressure is elevated above target today.  In order to address the patient's elevated BP: Blood pressure will be monitored at home to determine if medication changes need to be made.         Physical Exam:  VS:  BP (!) 160/80   Pulse 62   Ht 5' 5.5 (1.664 m)   Wt 147 lb (66.7 kg)   LMP 07/27/1994 (Approximate)   SpO2 99%   BMI 24.09 kg/m        Wt Readings from Last 3 Encounters:  11/03/23 147 lb (66.7 kg)  09/21/23 147 lb (66.7 kg)  08/11/23 150  lb (68 kg)    Constitutional:      Appearance: Healthy appearance. Not in distress.  Pulmonary:     Breath sounds: Normal breath sounds. No wheezing. No rales.  Cardiovascular:     Normal rate. Regular rhythm.     Murmurs: There is no murmur.  Edema:    Peripheral edema absent.       Assessment and Plan:    Assessment & Plan Essential hypertension BP is elevated today. She has had 2 BP readings (one with PCP and one with Gyn) that were optimal. She had a salty meal yesterday. She has not eaten and was planning to get labs drawn today. Since her BP  has been optimal over the last several weeks, until today, I will hold off on adjusting meds further. I have asked her to monitor her BP x 2 weeks. If running above target, will consider adding hydrochlorothiazide vs chlorthalidone. -Continue Amlodipine /Olmesartan  10/40 mg once daily, Metoprolol  tartrate 25 mg twice daily  -Monitor BP -Follow up with me in 1 month  -Bring blood pressure machine and blood pressure readings to next visit -If above target, add thiazide diuretic Coronary artery disease involving native coronary artery of native heart without angina pectoris History of MINOCA in June 2025.  She has been managed for vasospasm. She is doing well w/o chest pain to suggest angina. -Continue calcium  channel blocker in the form of Amlodipine /Olmesartan  10/40 mg once daily  -Continue ASA 81 mg once daily  -Continue Atorvastatin  80 mg once daily  Fibromuscular dysplasia of renal artery Remote history of renal artery angioplasty in 2011. Recent SCr normal. If blood pressure continues to run high, may need to get follow up RA dopplers.  Hypercholesterolemia Goal LDL <55. Recent LDL at goal. -Continue Atorvastatin  80 mg once daily         Dispo:  Return in about 4 weeks (around 12/01/2023) for Routine Follow Up, w/ Glendia Ferrier, PA-C.  Signed, Glendia Ferrier, PA-C

## 2023-11-02 NOTE — Assessment & Plan Note (Signed)
Goal LDL < 55

## 2023-11-03 ENCOUNTER — Encounter: Payer: Self-pay | Admitting: Physician Assistant

## 2023-11-03 ENCOUNTER — Ambulatory Visit: Admitting: Physician Assistant

## 2023-11-03 ENCOUNTER — Ambulatory Visit: Attending: Physician Assistant | Admitting: Physician Assistant

## 2023-11-03 VITALS — BP 160/80 | HR 62 | Ht 65.5 in | Wt 147.0 lb

## 2023-11-03 DIAGNOSIS — E7800 Pure hypercholesterolemia, unspecified: Secondary | ICD-10-CM | POA: Diagnosis not present

## 2023-11-03 DIAGNOSIS — I773 Arterial fibromuscular dysplasia: Secondary | ICD-10-CM

## 2023-11-03 DIAGNOSIS — I2510 Atherosclerotic heart disease of native coronary artery without angina pectoris: Secondary | ICD-10-CM | POA: Diagnosis not present

## 2023-11-03 DIAGNOSIS — I10 Essential (primary) hypertension: Secondary | ICD-10-CM | POA: Diagnosis not present

## 2023-11-03 NOTE — Assessment & Plan Note (Signed)
 BP is elevated today. She has had 2 BP readings (one with PCP and one with Gyn) that were optimal. She had a salty meal yesterday. She has not eaten and was planning to get labs drawn today. Since her BP has been optimal over the last several weeks, until today, I will hold off on adjusting meds further. I have asked her to monitor her BP x 2 weeks. If running above target, will consider adding hydrochlorothiazide vs chlorthalidone. -Continue Amlodipine /Olmesartan  10/40 mg once daily, Metoprolol  tartrate 25 mg twice daily  -Monitor BP -Follow up with me in 1 month  -Bring blood pressure machine and blood pressure readings to next visit -If above target, add thiazide diuretic

## 2023-11-03 NOTE — Patient Instructions (Signed)
 Medication Instructions:  Your physician recommends that you continue on your current medications as directed. Please refer to the Current Medication list given to you today. *If you need a refill on your cardiac medications before your next appointment, please call your pharmacy*  Lab Work: None ordered If you have labs (blood work) drawn today and your tests are completely normal, you will receive your results only by: MyChart Message (if you have MyChart) OR A paper copy in the mail If you have any lab test that is abnormal or we need to change your treatment, we will call you to review the results.  Testing/Procedures: None ordered  Follow-Up: At [facility], you and your health needs are our priority.  As part of our continuing mission to provide you with exceptional heart care, our providers are all part of one team.  This team includes your primary Cardiologist (physician) and Advanced Practice Providers or APPs (Physician Assistants and Nurse Practitioners) who all work together to provide you with the care you need, when you need it.  Your next appointment:   1 month(s)  Provider:   Glendia Ferrier, PA-C        We recommend signing up for the patient portal called MyChart.  Sign up information is provided on this After Visit Summary.  MyChart is used to connect with patients for Virtual Visits (Telemedicine).  Patients are able to view lab/test results, encounter notes, upcoming appointments, etc.  Non-urgent messages can be sent to your provider as well.   To learn more about what you can do with MyChart, go to [email or web address].   Other Instructions Check your blood pressure once a day for 2 weeks and drop your readings off at the office or call the office with your readings. If your blood pressure is greater than 150/85 3 times or more than please contact the office. BRING YOUR BLOOD PRESSURE MACHINE AND READINGS TO YOUR NEXT FOLLOW UP APPOINTMENT.

## 2023-11-10 IMAGING — MR MR BREAST BILAT WO/W CM
8 of 12 series · 33 of 48 positions shown · IV contrast (gadavist)
Comparison: No prior MRI. Multiple prior mammograms, most recently
08/15/2020.

CLINICAL DATA: 70-year-old with an elevated estimated lifetime risk
of breast cancer of greater than 20%. Family history of breast
cancer in 2 of her sisters in their 40s. Supplemental high risk
screening.

EXAM:
BILATERAL BREAST MRI WITH AND WITHOUT CONTRAST
TECHNIQUE: Multiplanar, multisequence MR images of both breasts were obtained
prior to and following the intravenous administration of 7 ml of
Gadavist.

[Series 2: t2_tirm_tra ipat (a-p) · axial · 3.0mm · 0.66mm/px · 1 of 62 slices shown]
[im 1/62]
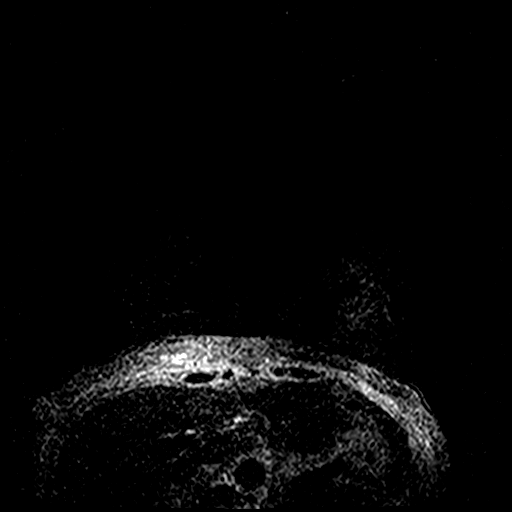

[Series 3: fl3d pre-cm no · axial · non-contrast · 1.2mm · 0.89mm/px · z∈[-102,+70]mm · 5 of 144 slices shown]
[im 1/144]
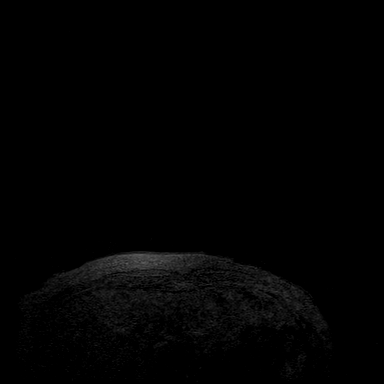
[im 36/144]
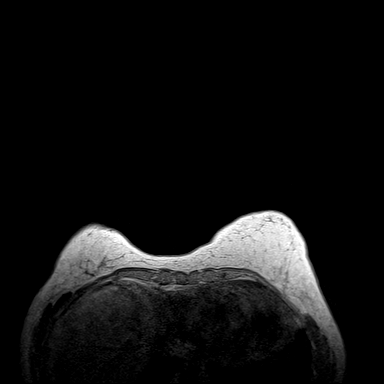
[im 72/144]
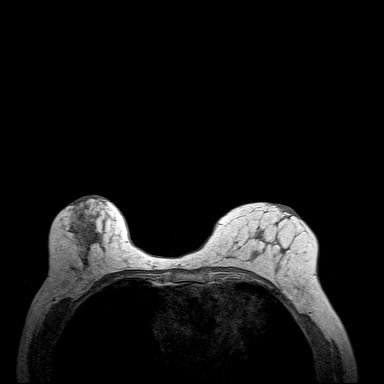
[im 108/144]
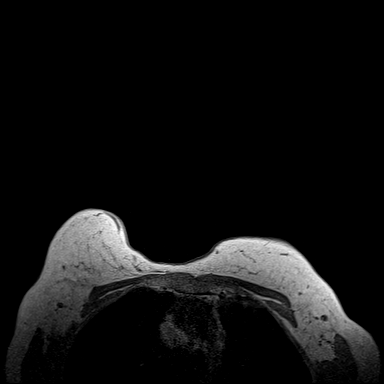
[im 144/144]
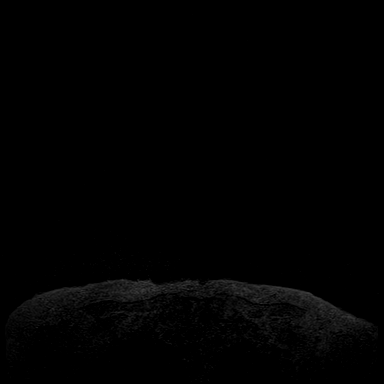

[Series 4: fl3d pre-cm · axial · non-contrast · 1.2mm · 0.89mm/px · z∈[-102,+70]mm · 5 of 144 slices shown]
[im 1/144]
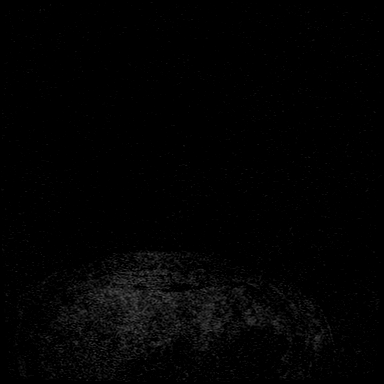
[im 36/144]
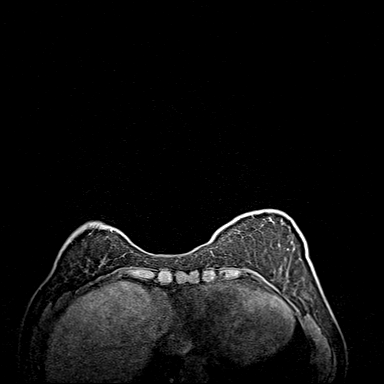
[im 72/144]
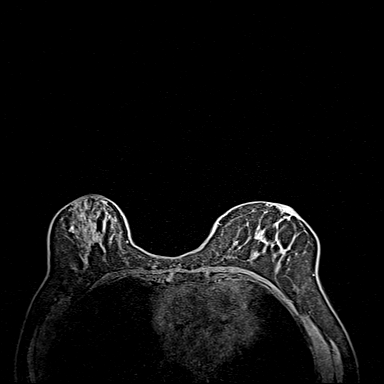
[im 108/144]
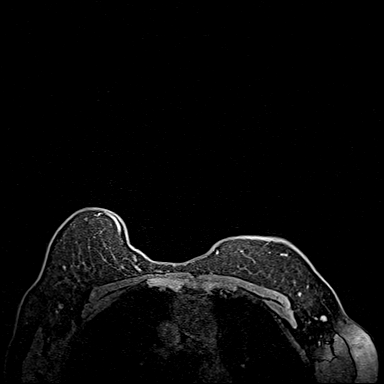
[im 144/144]
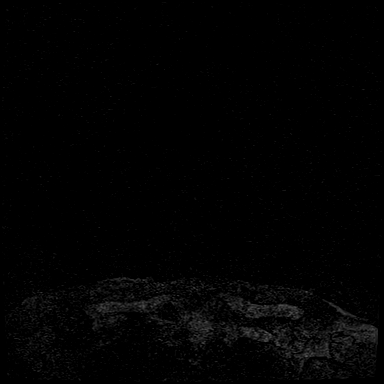

[Series 5: fl3d post immediate · axial · 1.2mm · 0.89mm/px · z∈[-102,+70]mm · 5 of 144 slices shown (1 of 3)]
[im 1/144]
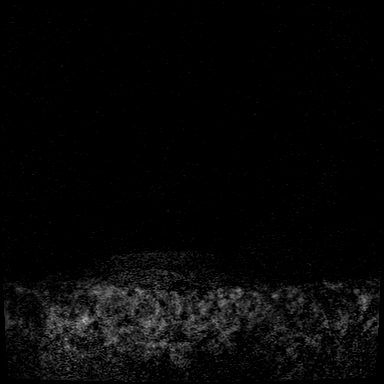
[im 36/144]
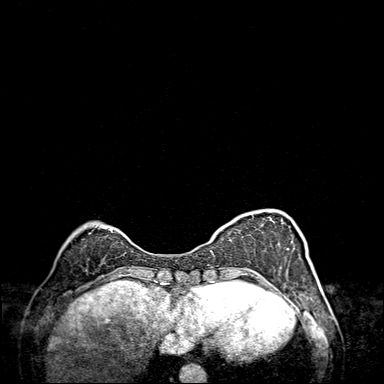
[im 72/144]
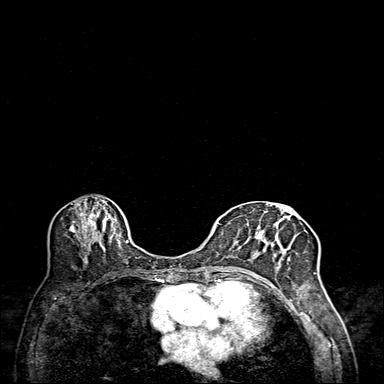
[im 108/144]
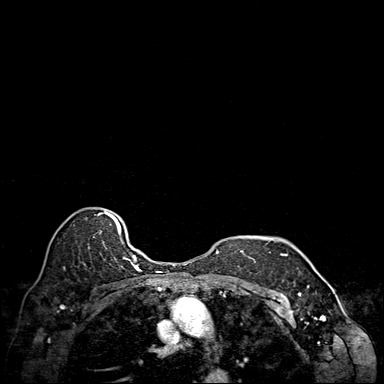
[im 144/144]
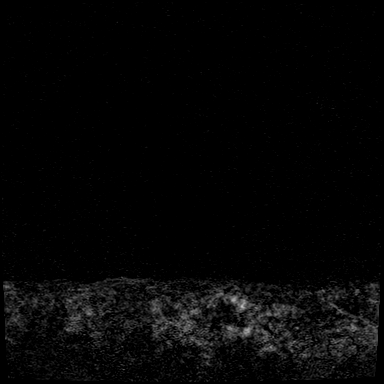

[Series 6: fl3d post immediate · axial · 1.2mm · 0.89mm/px · z∈[-102,+70]mm · 5 of 144 slices shown (2 of 3)]
[im 1/144]
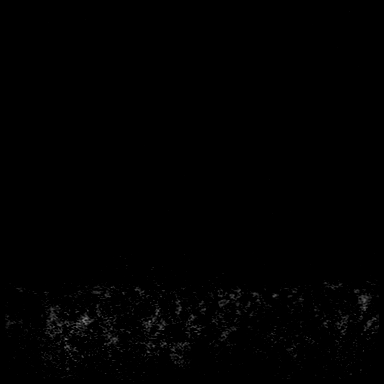
[im 36/144]
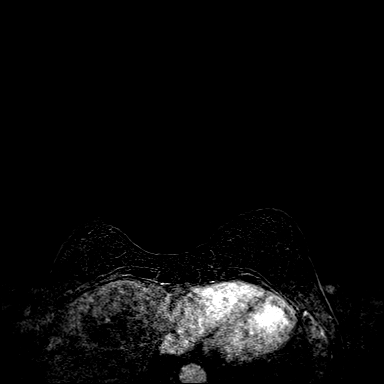
[im 72/144]
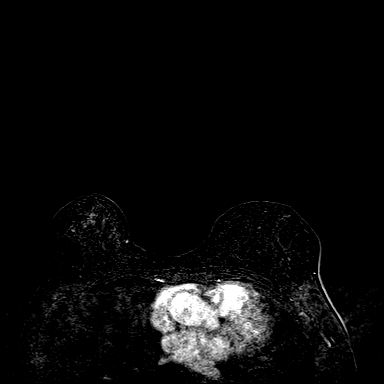
[im 108/144]
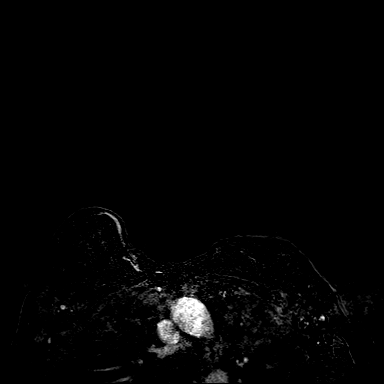
[im 144/144]
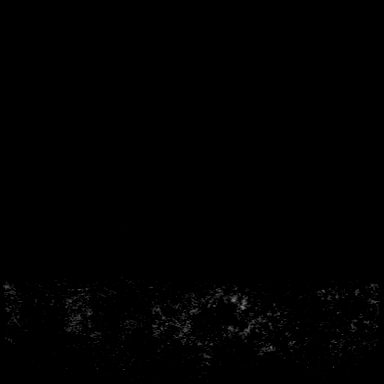

[Series 7: fl3d post immediate · axial · 172.8mm · 0.89mm/px · 1 of 1 slices shown (3 of 3)]
[im 1/1]
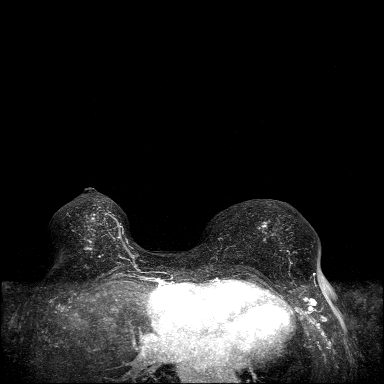

[Series 8: fl3d post 3min · axial · 1.2mm · 0.89mm/px · z∈[-102,+70]mm · 6 of 144 slices shown]
[im 1/144]
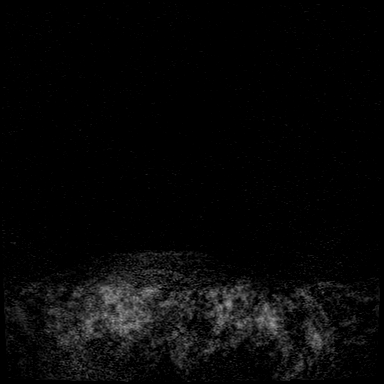
[im 29/144]
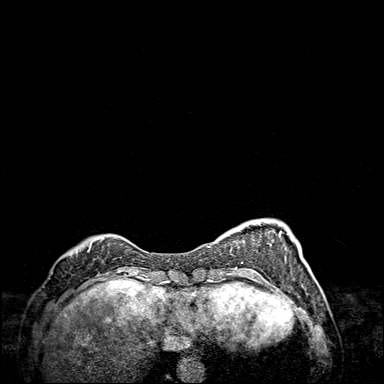
[im 58/144]
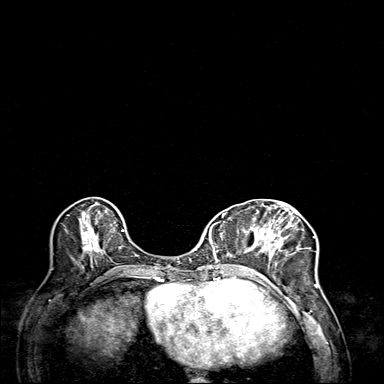
[im 86/144]
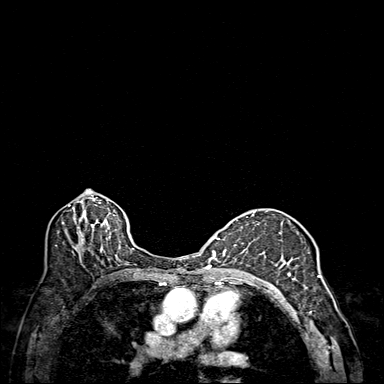
[im 115/144]
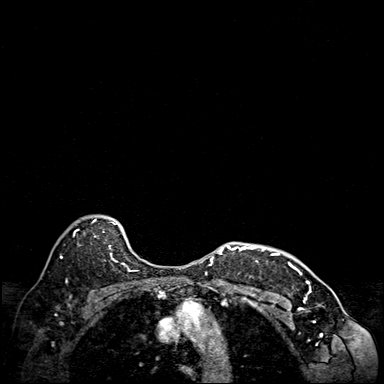
[im 144/144]
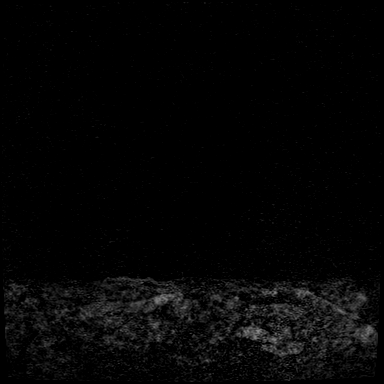

[Series 9: fl3d post 3min_sub · axial · 1.2mm · 0.89mm/px · z∈[-102,+35]mm · 5 of 144 slices shown]
[im 1/144]
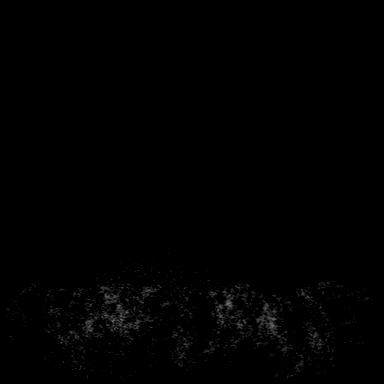
[im 29/144]
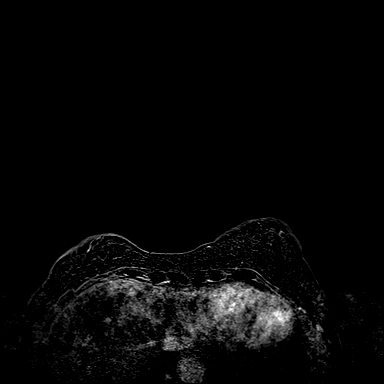
[im 58/144]
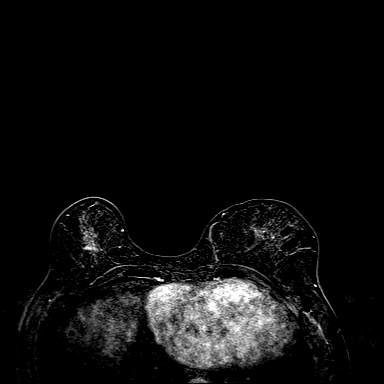
[im 86/144]
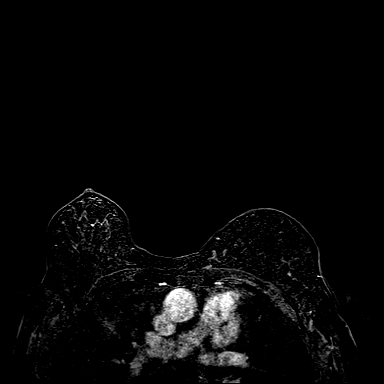
[im 115/144]
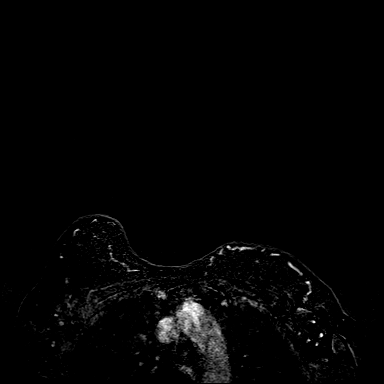

[33 of 48 positions shown; findings below may reference images not displayed]

Three-dimensional MR images were rendered by post-processing of the
original MR data on an independent workstation. The
three-dimensional MR images were interpreted, and findings are
reported in the following complete MRI report for this study. Three
dimensional images were evaluated at the independent interpreting
workstation using the DynaCAD thin client.
FINDINGS: Breast composition: c. Heterogeneous fibroglandular tissue.

Background parenchymal enhancement: Moderate, asymmetric and
increased on the RIGHT.

Right breast: No suspicious mass or abnormal enhancement. Scattered
T2 hyperintense and T2 hyperintense cysts.

Left breast: No suspicious mass or abnormal enhancement.

Lymph nodes: No pathologic lymphadenopathy.

Ancillary findings:  None.
IMPRESSION: No MRI evidence of malignancy involving either breast.

RECOMMENDATION:
1. Annual BILATERAL screening mammography which is due in late July 2021.
2. Consider annual supplemental high risk screening MRI as long as
the patient's estimated lifetime risk remains greater than 20%. If
performed, the supplemental screening MRI is recommended in 1 year.

BI-RADS CATEGORY  2: Benign.

## 2023-11-11 ENCOUNTER — Ambulatory Visit: Admitting: Physician Assistant

## 2023-11-16 ENCOUNTER — Ambulatory Visit: Admitting: Physician Assistant

## 2023-11-20 DIAGNOSIS — R079 Chest pain, unspecified: Secondary | ICD-10-CM | POA: Diagnosis not present

## 2023-11-21 ENCOUNTER — Other Ambulatory Visit: Payer: Self-pay

## 2023-11-21 ENCOUNTER — Encounter (HOSPITAL_COMMUNITY): Payer: Self-pay

## 2023-11-21 ENCOUNTER — Emergency Department (HOSPITAL_COMMUNITY)

## 2023-11-21 ENCOUNTER — Emergency Department (HOSPITAL_COMMUNITY)
Admission: EM | Admit: 2023-11-21 | Discharge: 2023-11-21 | Disposition: A | Discharge status: Home / Self Care | Attending: Emergency Medicine | Admitting: Emergency Medicine

## 2023-11-21 DIAGNOSIS — I201 Angina pectoris with documented spasm: Secondary | ICD-10-CM | POA: Insufficient documentation

## 2023-11-21 DIAGNOSIS — Z7982 Long term (current) use of aspirin: Secondary | ICD-10-CM | POA: Diagnosis not present

## 2023-11-21 DIAGNOSIS — R079 Chest pain, unspecified: Secondary | ICD-10-CM

## 2023-11-21 DIAGNOSIS — R0789 Other chest pain: Secondary | ICD-10-CM | POA: Insufficient documentation

## 2023-11-21 DIAGNOSIS — E876 Hypokalemia: Secondary | ICD-10-CM | POA: Diagnosis not present

## 2023-11-21 DIAGNOSIS — J90 Pleural effusion, not elsewhere classified: Secondary | ICD-10-CM | POA: Diagnosis not present

## 2023-11-21 DIAGNOSIS — I10 Essential (primary) hypertension: Secondary | ICD-10-CM | POA: Insufficient documentation

## 2023-11-21 LAB — CBC
HCT: 37.4 % (ref 36.0–46.0)
Hemoglobin: 11.9 g/dL — ABNORMAL LOW (ref 12.0–15.0)
MCH: 29.4 pg (ref 26.0–34.0)
MCHC: 31.8 g/dL (ref 30.0–36.0)
MCV: 92.3 fL (ref 80.0–100.0)
Platelets: 252 K/uL (ref 150–400)
RBC: 4.05 MIL/uL (ref 3.87–5.11)
RDW: 13.5 % (ref 11.5–15.5)
WBC: 6 K/uL (ref 4.0–10.5)
nRBC: 0 % (ref 0.0–0.2)

## 2023-11-21 LAB — BASIC METABOLIC PANEL WITH GFR
Anion gap: 13 (ref 5–15)
BUN: 12 mg/dL (ref 8–23)
CO2: 23 mmol/L (ref 22–32)
Calcium: 9.1 mg/dL (ref 8.9–10.3)
Chloride: 105 mmol/L (ref 98–111)
Creatinine, Ser: 0.75 mg/dL (ref 0.44–1.00)
GFR, Estimated: >60 mL/min (ref >60)
Glucose, Bld: 120 mg/dL — ABNORMAL HIGH (ref 70–99)
Potassium: 3.4 mmol/L — ABNORMAL LOW (ref 3.5–5.1)
Sodium: 141 mmol/L (ref 135–145)

## 2023-11-21 LAB — TROPONIN I (HIGH SENSITIVITY)
Troponin I (High Sensitivity): 11 ng/L (ref <18)
Troponin I (High Sensitivity): 11 ng/L (ref <18)

## 2023-11-21 MED ORDER — POTASSIUM CHLORIDE CRYS ER 20 MEQ PO TBCR
40.0000 meq | EXTENDED_RELEASE_TABLET | Freq: Once | ORAL | Status: AC
  Administered 2023-11-21: 40 meq via ORAL
  Filled 2023-11-21: qty 2

## 2023-11-21 NOTE — Consult Note (Signed)
 Cardiology Consultation   Patient ID: EMMILIA SOWDER MRN: [id number]; DOB: 05-28-1950  Admit date: 11/21/2023 Date of Consult: 11/21/2023  PCP:  Corlis Pagan, NP   [facility] Providers Cardiologist:  Dorn Lesches, MD        Patient Profile: Joanne Duncan is a 73 y.o. female with a hx of mild CAD with [facility] managed as possible coronary vasospasm by cath 06/2023, HTN, fibromuscular dysplasia of renal artery s/p renal artery angioplasty in 2011, HLD, RBBB+LAFB who is being seen 11/21/2023 for the evaluation of chest pain at the request of Dr. Laurice.  History of Present Illness: Joanne Duncan follows with Dr. Lesches with the above history. She was admitted 06/2023 with CP/troponin peak of 589. Cath showed 20% prox LAD, otherwise no angiographically significant CAD, + tortuous arteries suggestive of longstanding HTN. She was managed as possible coronary vasospasm. Not placed on DAPT, only aspirin  only. 2D echo 07/22/23 EF 65-70%, mild concentric LVH, mild-moderate LAE. Last seen by Glendia Ferrier on 11/03/23 with elevated BP, though home BP had been running OK, planned to follow and consider adding thiazide diuretic in follow-up.  She has been in USOH lately remaining very active walking approximately 2 miles a day many days out of the week. She also does an hour long [employer] class on Wednesdays. She last did her walk on Thursday (3 days ago) and felt fine. On Friday evening she began noticing a traveling, variable chest discomfort that lasts seconds at a time, sometimes going minutes to hours between episodes. It sometimes goes under her left breast then other times right on her sternum. It is not provoked by anything or exacerbated by exertion or inspiration. It does not feel like prior angina. She took a Tylenol  and did have some relief. Given cardiac hx she came to ED. Here, ED BP 171/67. CXR shows small bilateral pleural effusions per report but Dr. Barbaraann personally  reviewed and did think these were clinically significant.  Labs show K 3.4, glucose 120, hsTroponin neg x2, Hgb 11.9 (stable), plt wnl. EKG shows NSR with known RBBB+LAFB similar to prior. Ordered KCL 40meq. F/u BP tends in ED 120s-130s, last 132/65. She reports home BP readings have been 120s-130s. Feeling fine now.  Past Medical History:  Diagnosis Date   Arthritis    right hip   CAD (coronary artery disease) 11/02/2023   MINOCA 2/2 Vasospasm - Admitted with NSTEMI in 06/2023 - LHC 07/22/2023: p LAD 20    TTE 07/22/2023: EF 65-70, no RWMA, mild LVH, normal RVSF, normal PASP, mild-moderate LAE, trivial effusion     Fibroid    TAH 7/96   Fibromuscular dysplasia of renal artery    Right   Hypertension    Bilateral renal artery fibromuscular dysplasia   MI (myocardial infarction) (HCC) 07/22/2023   Osteoporosis    Thyroid  disease    hypothyroid, thyroid  goiter   Vitamin D  deficiency     Past Surgical History:  Procedure Laterality Date   ABDOMINAL HYSTERECTOMY  7/96   ovaries remain, done secondary to  fibroids   Angioplasty of Right Renal Artery  01-16-2010   Recurrent/residual fibromuscular dysplasia   LEFT HEART CATH AND CORONARY ANGIOGRAPHY N/A 07/22/2023   Procedure: LEFT HEART CATH AND CORONARY ANGIOGRAPHY;  Surgeon: Mady Bruckner, MD;  Location: [facility] INVASIVE CV LAB;  Service: Cardiovascular;  Laterality: N/A;     Home Medications:  Prior to Admission medications   Medication Sig Start Date End Date Taking? Authorizing Provider  amLODipine -olmesartan  (  AZOR ) 10-40 MG tablet Take 1 tablet by mouth every morning. 08/11/23   Court Dorn PARAS, MD  aspirin  EC 81 MG tablet Take 1 tablet (81 mg total) by mouth daily. Swallow whole. 07/23/23   Regalado, Belkys A, MD  atorvastatin  (LIPITOR ) 80 MG tablet Take 1 tablet (80 mg total) by mouth daily. 10/18/23   Court Dorn PARAS, MD  Cholecalciferol (VITAMIN D3) 125 MCG (5000 UT) TABS Take 1 tablet by mouth daily.    [provider]   Ferrous Sulfate (IRON) 325 (65 Fe) MG TABS Take by mouth.    [provider]  HYDROcodone -acetaminophen  (NORCO/VICODIN) 5-325 MG per tablet Take 2 tablets by mouth every 4 (four) hours as needed. 08/03/14   Tapia, Leisa, PA-C  metoprolol  tartrate (LOPRESSOR ) 25 MG tablet Take 1 tablet (25 mg total) by mouth 2 (two) times daily. 09/22/23   Court Dorn PARAS, MD  Multiple Vitamin (MULTIVITAMIN) tablet Take 1 tablet by mouth daily.    [provider]  Propylene Glycol (SYSTANE COMPLETE) 0.6 % SOLN Place 1 drop into both eyes as needed (dry eyes).    [provider]  tiZANidine (ZANAFLEX) 2 MG tablet Take 4 mg by mouth at bedtime as needed for muscle spasms.    [provider]    Scheduled Meds:  Continuous Infusions:  PRN Meds:   Allergies:   No Known Allergies  Social History:   Social History   Socioeconomic History   Marital status: Divorced    Spouse name: Not on file   Number of children: 1   Years of education: Not on file   Highest education level: Not on file  Occupational History   Occupation: ASSEMBLY    Employer: [employer]  Tobacco Use   Smoking status: Never    Passive exposure: Never   Smokeless tobacco: Never  Vaping Use   Vaping status: Never Used  Substance and Sexual Activity   Alcohol use: Not Currently    Comment: occasional wine   Drug use: No   Sexual activity: Not Currently    Partners: Male    Birth control/protection: Surgical    Comment: hysterectomy, menarche 73yo, sexual debut before age 77, all other medicare questions no  Other Topics Concern   Not on file  Social History Narrative   Works at [employer].  Divorced and has 1 son.   Social Drivers of Corporate Investment Banker Strain: Not on file  Food Insecurity: No Food Insecurity (07/22/2023)   Hunger Vital Sign    Worried About Running Out of Food in the Last Year: Never true    Ran Out of Food in the Last Year: Never true  Transportation Needs: No  Transportation Needs (07/22/2023)   PRAPARE - Administrator, Civil Service (Medical): No    Lack of Transportation (Non-Medical): No  Physical Activity: Not on file  Stress: Not on file  Social Connections: Moderately Isolated (07/22/2023)   Social Connection and Isolation Panel    Frequency of Communication with Friends and Family: More than three times a week    Frequency of Social Gatherings with Friends and Family: Twice a week    Attends Religious Services: 1 to 4 times per year    Active Member of [employer] or Organizations: No    Attends Banker Meetings: Never    Marital Status: Divorced  Catering Manager Violence: Not At Risk (07/22/2023)   Humiliation, Afraid, Rape, and Kick questionnaire    Fear of Current  or Ex-Partner: No    Emotionally Abused: No    Physically Abused: No    Sexually Abused: No    Family History:    Family History  Problem Relation Age of Onset   Breast cancer Sister 58   Breast cancer Sister 42       breast cancer recurrence     ROS:  Please see the history of present illness.  All other ROS reviewed and negative.     Physical Exam/Data: Vitals:   11/21/23 0209 11/21/23 0228 11/21/23 0641  BP: (!) 166/74  (!) 171/67  Pulse: 78  80  Resp: 16  19  Temp: 98.6 F (37 C)  99 F (37.2 C)  SpO2: 100%  100%  Weight:  66.7 kg   Height:  5' 5.5 (1.664 m)    No intake or output data in the 24 hours ending 11/21/23 0843    11/21/2023    2:28 AM 11/03/2023    8:48 AM 09/21/2023    8:35 AM  Last 3 Weights  Weight (lbs) 146 lb 15.7 oz 147 lb 147 lb  Weight (kg) 66.67 kg 66.679 kg 66.679 kg     Body mass index is 24.09 kg/m.  General: Well developed, well nourished, in no acute distress. Head: Normocephalic, atraumatic, sclera non-icteric, no xanthomas, nares are without discharge. Neck: Negative for carotid bruits. JVP not elevated. Lungs: Clear bilaterally to auscultation without wheezes, rales, or rhonchi. Breathing is  unlabored. Heart: RRR S1 S2 without murmurs, rubs, or gallops.  Abdomen: Soft, non-tender, non-distended with normoactive bowel sounds. No rebound/guarding. Extremities: No clubbing or cyanosis. No edema. Distal pedal pulses are 2+ and equal bilaterally. Neuro: Alert and oriented X 3. Moves all extremities spontaneously. Psych:  Responds to questions appropriately with a normal affect.   EKG:  The EKG was personally reviewed and demonstrates:  NSR with sinus arrhythmia, RBBB, LAFB, no acute changes from prior   Telemetry:  Telemetry was personally reviewed and demonstrates:  NSR  Relevant CV Studies:  Cardiac Cath 06/2023 Conclusions: Mild, nonobstructive coronary artery disease with 20% stenosis in the proximal LAD.  Otherwise, no angiographically significant disease consistent with MI with nonobstructive coronary artery disease (MINOCA). Significant coronary artery tortuosity suggestive of longstanding hypertension. Normal left ventricular systolic function and filling pressure.   Recommendations: Continue medical therapy and risk factor modification to prevent progression of coronary artery disease. Consider further workup for alternative etiologies of elevated troponin and left arm pain per primary team.   Lonni Hanson, MD [facility]  2D echo 06/2023  1. Left ventricular ejection fraction, by estimation, is 65 to 70%. The  left ventricle has normal function. The left ventricle has no regional  wall motion abnormalities. There is mild concentric left ventricular  hypertrophy. Left ventricular diastolic  parameters are indeterminate.   2. Right ventricular systolic function is normal. The right ventricular  size is normal. There is normal pulmonary artery systolic pressure.   3. Left atrial size was mild to moderately dilated.   4. There is no evidence of cardiac tamponade.   5. The mitral valve is normal in structure. No evidence of mitral valve  regurgitation.   6.  The aortic valve is tricuspid. Aortic valve regurgitation is not  visualized.   7. The inferior vena cava is normal in size with greater than 50%  respiratory variability, suggesting right atrial pressure of 3 mmHg.    Laboratory Data: High Sensitivity Troponin:   Recent Labs  Lab 11/21/23 0235  11/21/23 0457  TROPONINIHS 11 11     Chemistry Recent Labs  Lab 11/21/23 0235  NA 141  K 3.4*  CL 105  CO2 23  GLUCOSE 120*  BUN 12  CREATININE 0.75  CALCIUM  9.1  GFRNONAA >60  ANIONGAP 13    No results for input(s): PROT, ALBUMIN, AST, ALT, ALKPHOS, BILITOT in the last 168 hours. Lipids No results for input(s): CHOL, TRIG, HDL, LABVLDL, LDLCALC, CHOLHDL in the last 168 hours.  Hematology Recent Labs  Lab 11/21/23 0235  WBC 6.0  RBC 4.05  HGB 11.9*  HCT 37.4  MCV 92.3  MCH 29.4  MCHC 31.8  RDW 13.5  PLT 252   Thyroid  No results for input(s): TSH, FREET4 in the last 168 hours.  BNPNo results for input(s): BNP, PROBNP in the last 168 hours.  DDimer No results for input(s): DDIMER in the last 168 hours.  Radiology/Studies:  DG Chest 2 View Result Date: 11/21/2023 EXAM: 2 VIEW(S) XRAY OF THE CHEST 11/21/2023 02:36:00 AM COMPARISON: Comparison made to July 23, 2023. CLINICAL HISTORY: CP. Encounter for chest pain. CP. Encounter for chest pain. FINDINGS: LUNGS AND PLEURA: Lungs are clear. Small bilateral pleural effusions. No pneumothorax. HEART AND MEDIASTINUM: No acute abnormality of the cardiac and mediastinal silhouettes. BONES AND SOFT TISSUES: No acute osseous abnormality. IMPRESSION: 1. Small bilateral pleural effusions. Electronically signed by: Dorethia Molt MD 11/21/2023 02:42 AM EDT RP Workstation: [id number]     Assessment and Plan:  1. Atypical chest pain, history of minimal CAD with MINOCA/possible vasospasm 06/2023 - fleeting atypical discomfort, sounds MSK in etiology, unlike prior angina, no objective evidence of ischemia, no  recent exertional anginal symptoms - will review further plan with Dr. Barbaraann; does have follow-up scheduled with Dr Court 12/06/23  2. Question of small bilateral effusions on CXR - no clinical s/sx of HF, no orthopnea, PND, edema; no corresponding symptoms  - Dr. Barbaraann reviewed and does not feel this appears significant  - no acute intervention needed  3. Essential HTN - initially hypertensive on arrival then improved without intervention 120s-132 systolic - reports good control at home - continue current regimen  4. Hypokalemia - K 3.4, usually normal - will replete x1 - consider recheck in follow-up    Risk Assessment/Risk Scores:      For questions or updates, please contact [facility] Please consult [email or web address] for contact info under      Signed, Lyrik Buresh N Krisy Dix, PA-C  11/21/2023 8:43 AM

## 2023-11-21 NOTE — ED Provider Notes (Signed)
 [place] EMERGENCY DEPARTMENT AT [facility] Provider Note   CSN: [id number] Arrival date & time: 11/21/23  0203     Patient presents with: Chest Pain   Joanne Duncan is a 73 y.o. female.   73 year old female with prior medical history as detailed below presents for evaluation.  Patient complains of left-sided  chest pain.  Symptoms began yesterday afternoon.  She reports intermittent discomfort.  Currently she denies any active chest pain.  History of NSTEMI in June.  Patient with a 20% proximal LAD lesion found on cardiac cath at that time.  NSTEMI was thought to be secondary to vasospasm.  Patient reports that she does not typically have any chest pain.  Primary cardiologist is Dr. Court.  The history is provided by the patient and medical records.       Prior to Admission medications   Medication Sig Start Date End Date Taking? Authorizing Provider  amLODipine -olmesartan  (AZOR ) 10-40 MG tablet Take 1 tablet by mouth every morning. 08/11/23   Court Dorn PARAS, MD  aspirin  EC 81 MG tablet Take 1 tablet (81 mg total) by mouth daily. Swallow whole. 07/23/23   Regalado, Belkys A, MD  atorvastatin  (LIPITOR ) 80 MG tablet Take 1 tablet (80 mg total) by mouth daily. 10/18/23   Court Dorn PARAS, MD  Cholecalciferol (VITAMIN D3) 125 MCG (5000 UT) TABS Take 1 tablet by mouth daily.    [provider]  Ferrous Sulfate (IRON) 325 (65 Fe) MG TABS Take by mouth.    [provider]  HYDROcodone -acetaminophen  (NORCO/VICODIN) 5-325 MG per tablet Take 2 tablets by mouth every 4 (four) hours as needed. 08/03/14   Tapia, Leisa, PA-C  metoprolol  tartrate (LOPRESSOR ) 25 MG tablet Take 1 tablet (25 mg total) by mouth 2 (two) times daily. 09/22/23   Court Dorn PARAS, MD  Multiple Vitamin (MULTIVITAMIN) tablet Take 1 tablet by mouth daily.    [provider]  Propylene Glycol (SYSTANE COMPLETE) 0.6 % SOLN Place 1 drop into both eyes as needed (dry eyes).     [provider]  tiZANidine (ZANAFLEX) 2 MG tablet Take 4 mg by mouth at bedtime as needed for muscle spasms.    [provider]    Allergies: Patient has no known allergies.    Review of Systems  All other systems reviewed and are negative.   Updated Vital Signs BP (!) 171/67 (BP Location: Right Arm)   Pulse 80   Temp 99 F (37.2 C)   Resp 19   Ht 5' 5.5 (1.664 m)   Wt 66.7 kg   LMP 07/27/1994 (Approximate)   SpO2 100%   BMI 24.09 kg/m   Physical Exam Vitals and nursing note reviewed.  Constitutional:      General: She is not in acute distress.    Appearance: Normal appearance. She is well-developed.  HENT:     Head: Normocephalic and atraumatic.  Eyes:     Conjunctiva/sclera: Conjunctivae normal.     Pupils: Pupils are equal, round, and reactive to light.  Cardiovascular:     Rate and Rhythm: Normal rate and regular rhythm.     Heart sounds: Normal heart sounds.  Pulmonary:     Effort: Pulmonary effort is normal. No respiratory distress.     Breath sounds: Normal breath sounds.  Abdominal:     General: There is no distension.     Palpations: Abdomen is soft.     Tenderness: There is no abdominal tenderness.  Musculoskeletal:  General: No deformity. Normal range of motion.     Cervical back: Normal range of motion and neck supple.  Skin:    General: Skin is warm and dry.  Neurological:     General: No focal deficit present.     Mental Status: She is alert and oriented to person, place, and time.     (all labs ordered are listed, but only abnormal results are displayed) Labs Reviewed  BASIC METABOLIC PANEL WITH GFR - Abnormal; Notable for the following components:      Result Value   Potassium 3.4 (*)    Glucose, Bld 120 (*)    All other components within normal limits  CBC - Abnormal; Notable for the following components:   Hemoglobin 11.9 (*)    All other components within normal limits  TROPONIN I (HIGH SENSITIVITY)   TROPONIN I (HIGH SENSITIVITY)    EKG: EKG Interpretation Date/Time:  Sunday November 21 2023 02:18:30 EDT Ventricular Rate:  80 PR Interval:  134 QRS Duration:  120 QT Interval:  402 QTC Calculation: 463 R Axis:   -46  Text Interpretation: Normal sinus rhythm with sinus arrhythmia Right bundle branch block Left anterior fascicular block Bifascicular block Abnormal ECG When compared with ECG of 21-Jul-2023 18:16, PREVIOUS ECG IS PRESENT Confirmed by Laurice Coy [phone]) on 11/21/2023 7:49:33 AM  Radiology: [facility] Chest 2 View Result Date: 11/21/2023 EXAM: 2 VIEW(S) XRAY OF THE CHEST 11/21/2023 02:36:00 AM COMPARISON: Comparison made to July 23, 2023. CLINICAL HISTORY: CP. Encounter for chest pain. CP. Encounter for chest pain. FINDINGS: LUNGS AND PLEURA: Lungs are clear. Small bilateral pleural effusions. No pneumothorax. HEART AND MEDIASTINUM: No acute abnormality of the cardiac and mediastinal silhouettes. BONES AND SOFT TISSUES: No acute osseous abnormality. IMPRESSION: 1. Small bilateral pleural effusions. Electronically signed by: Dorethia Molt MD 11/21/2023 02:42 AM EDT RP Workstation: [id number]     Procedures   Medications Ordered in the ED - No data to display                                  Medical Decision Making Patient presents with left-sided chest pain.  Initial EKG is reassuring.  Troponins are 11 and 11.  Other obtained labs and imaging are without acute abnormality noted.  Cardiology is aware of case and will evaluate in consultation.  Patient's presentation is not consistent with likely ACS.  Cardiology agrees with plan to discharge home.  Patient understands need for close follow-up with Dr. Court next month.  Strict return precautions given and understood.  Amount and/or Complexity of Data Reviewed Labs: ordered. Radiology: ordered.        Final diagnoses:  Chest pain, unspecified type    ED Discharge Orders     None           Laurice Coy BROCKS, MD 11/21/23 1025

## 2023-11-21 NOTE — Discharge Instructions (Addendum)
 Return for any problem.  ?

## 2023-11-21 NOTE — ED Triage Notes (Signed)
 Pt reports CP under her L breast x 1 day. No radiation. No SHOB.

## 2023-12-06 ENCOUNTER — Encounter: Payer: Self-pay | Admitting: Cardiovascular Disease

## 2023-12-06 ENCOUNTER — Ambulatory Visit: Attending: Cardiovascular Disease | Admitting: Cardiovascular Disease

## 2023-12-06 VITALS — BP 150/70 | HR 66 | Ht 65.5 in | Wt 143.0 lb

## 2023-12-06 DIAGNOSIS — I10 Essential (primary) hypertension: Secondary | ICD-10-CM | POA: Diagnosis not present

## 2023-12-06 DIAGNOSIS — E7800 Pure hypercholesterolemia, unspecified: Secondary | ICD-10-CM

## 2023-12-06 DIAGNOSIS — I2510 Atherosclerotic heart disease of native coronary artery without angina pectoris: Secondary | ICD-10-CM

## 2023-12-06 DIAGNOSIS — I773 Arterial fibromuscular dysplasia: Secondary | ICD-10-CM | POA: Diagnosis not present

## 2023-12-06 NOTE — Assessment & Plan Note (Signed)
 History of non-STEMI 6/25 with cath performed Dr. Mady revealing at most a 20% proximal LAD lesion.  It was assumed that her symptoms were related to coronary vasospasm.  She was just seen in the hospital again 07/23/2023 with atypical noncardiac chest pain thought to be related to musculoskeletal pain.  She has not had any chest pain since that time.

## 2023-12-06 NOTE — Patient Instructions (Signed)
 Medication Instructions:  Your physician recommends that you continue on your current medications as directed. Please refer to the Current Medication list given to you today.  *If you need a refill on your cardiac medications before your next appointment, please call your pharmacy*    Follow-Up: At [facility], you and your health needs are our priority.  As part of our continuing mission to provide you with exceptional heart care, our providers are all part of one team.  This team includes your primary Cardiologist (physician) and Advanced Practice Providers or APPs (Physician Assistants and Nurse Practitioners) who all work together to provide you with the care you need, when you need it.  Your next appointment:   1 year(s)  Provider:   Dorn Lesches, MD

## 2023-12-06 NOTE — Assessment & Plan Note (Signed)
 History of essential hypertension with blood pressure measured today at 150/70.  I did look at her blood pressure log from home with blood pressures in the normal range.  She is on metoprolol , amlodipine  and olmesartan .

## 2023-12-06 NOTE — Progress Notes (Signed)
 12/06/2023 Joanne Duncan   03-06-1950  [id number]  Primary Physician Corlis Pagan, NP Primary Cardiologist: Joanne Duncan Joanne Lesches MD Joanne Duncan, [name]  HPI:  Joanne Duncan is a 73 y.o.  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  She is being seen for posthospital follow-up.  Risk factors include treated hypertension hyperlipidemia.  She has never had a heart attack but did have a non-STEMI 07/21/2023 with troponins that went up to 589.  She had cardiac catheterization performed that her and which revealed minimal proximal LAD disease.  Her symptoms were thought to be coronary vasospasm related.  She has had no recurrent chest pain.   Current Meds  Medication Sig   amLODipine -olmesartan  (AZOR ) 10-40 MG tablet Take 1 tablet by mouth every morning.   aspirin  EC 81 MG tablet Take 1 tablet (81 mg total) by mouth daily. Swallow whole.   atorvastatin  (LIPITOR ) 80 MG tablet Take 1 tablet (80 mg total) by mouth daily.   Cholecalciferol (VITAMIN D3) 125 MCG (5000 UT) TABS Take 1 tablet by mouth daily.   Ferrous Sulfate (IRON) 325 (65 Fe) MG TABS Take by mouth.   HYDROcodone -acetaminophen  (NORCO/VICODIN) 5-325 MG per tablet Take 2 tablets by mouth every 4 (four) hours as needed.   metoprolol  tartrate (LOPRESSOR ) 25 MG tablet Take 1 tablet (25 mg total) by mouth 2 (two) times daily.   Multiple Vitamin (MULTIVITAMIN) tablet Take 1 tablet by mouth daily.   Propylene Glycol (SYSTANE COMPLETE) 0.6 % SOLN Place 1 drop into both eyes as needed (dry eyes).   tiZANidine (ZANAFLEX) 2 MG tablet Take 4 mg by mouth at bedtime as needed for muscle spasms.     No Known Allergies  Social History   Socioeconomic History   Marital status: Divorced    Spouse name: Not on file   Number of children: 1   Years of education: Not on file   Highest education  level: Not on file  Occupational History   Occupation: ASSEMBLY    Employer: [employer]  Tobacco Use   Smoking status: Never    Passive exposure: Never   Smokeless tobacco: Never  Vaping Use   Vaping status: Never Used  Substance and Sexual Activity   Alcohol use: Not Currently    Comment: occasional wine   Drug use: No   Sexual activity: Not Currently    Partners: Male    Birth control/protection: Surgical    Comment: hysterectomy, menarche 73yo, sexual debut before age 63, all other medicare questions no  Other Topics Concern   Not on file  Social History Narrative   Works at [employer].  Divorced and has 1 son.   Social Drivers of Corporate Investment Banker Strain: Not on file  Food Insecurity: No Food Insecurity (07/22/2023)   Hunger Vital Sign    Worried About Running Out of Food in the Last Year: Never true    Ran Out of Food in the Last Year: Never true  Transportation Needs: No Transportation Needs (07/22/2023)   PRAPARE - Administrator, Civil Service (Medical): No    Lack of Transportation (Non-Medical): No  Physical Activity: Not on file  Stress: Not on file  Social Connections: Moderately Isolated (07/22/2023)   Social Connection and Isolation Panel    Frequency of Communication with Friends and Family: More than three times  a week    Frequency of Social Gatherings with Friends and Family: Twice a week    Attends Religious Services: 1 to 4 times per year    Active Member of [employer] or Organizations: No    Attends Banker Meetings: Never    Marital Status: Divorced  Catering Manager Violence: Not At Risk (07/22/2023)   Humiliation, Afraid, Rape, and Kick questionnaire    Fear of Current or Ex-Partner: No    Emotionally Abused: No    Physically Abused: No    Sexually Abused: No     Review of Systems: General: negative for chills, fever, night sweats or weight changes.  Cardiovascular: negative for chest pain, dyspnea on exertion, edema,  orthopnea, palpitations, paroxysmal nocturnal dyspnea or shortness of breath Dermatological: negative for rash Respiratory: negative for cough or wheezing Urologic: negative for hematuria Abdominal: negative for nausea, vomiting, diarrhea, bright red blood per rectum, melena, or hematemesis Neurologic: negative for visual changes, syncope, or dizziness All other systems reviewed and are otherwise negative except as noted above.    Blood pressure (!) 150/70, pulse 66, height 5' 5.5 (1.664 m), weight 143 lb (64.9 kg), last menstrual period 07/27/1994.  General appearance: alert and no distress Neck: no adenopathy, no carotid bruit, no JVD, supple, symmetrical, trachea midline, and thyroid  not enlarged, symmetric, no tenderness/mass/nodules Lungs: clear to auscultation bilaterally Heart: regular rate and rhythm, S1, S2 normal, no murmur, click, rub or gallop Extremities: extremities normal, atraumatic, no cyanosis or edema Pulses: 2+ and symmetric Skin: Skin color, texture, turgor normal. No rashes or lesions Neurologic: Grossly normal  EKG not performed today      ASSESSMENT AND PLAN:   Essential hypertension History of essential hypertension with blood pressure measured today at 150/70.  I did look at her blood pressure log from home with blood pressures in the normal range.  She is on metoprolol , amlodipine  and olmesartan .  Hypercholesterolemia History of hyperlipidemia on statin therapy with lipid profile performed 10/05/2023 revealing total cholesterol 118, LDL 36 and HDL 70.  CAD (coronary artery disease) History of non-STEMI 6/25 with cath performed Dr. Mady revealing at most a 20% proximal LAD lesion.  It was assumed that her symptoms were related to coronary vasospasm.  She was just seen in the hospital again 07/23/2023 with atypical noncardiac chest pain thought to be related to musculoskeletal pain.  She has not had any chest pain since that time.  Fibromuscular dysplasia of  renal artery Apparently followed by the [place] [facility].  (Dr. Alica)     Joanne Duncan Joanne Duncan Lesches MD [facility], [facility] 12/06/2023 8:31 AM

## 2023-12-06 NOTE — Assessment & Plan Note (Signed)
 History of hyperlipidemia on statin therapy with lipid profile performed 10/05/2023 revealing total cholesterol 118, LDL 36 and HDL 70.

## 2023-12-06 NOTE — Assessment & Plan Note (Signed)
 Apparently followed by the [place] [facility].  (Dr. Alica)
